# Patient Record
Sex: Male | Born: 1955
Health system: Southern US, Community
[De-identification: ages and names within clinical notes are randomized; demographics above are authoritative.]

## PROBLEM LIST (undated history)

## (undated) DIAGNOSIS — F32A Depression, unspecified: Secondary | ICD-10-CM

## (undated) DIAGNOSIS — F329 Major depressive disorder, single episode, unspecified: Secondary | ICD-10-CM

## (undated) DIAGNOSIS — B1920 Unspecified viral hepatitis C without hepatic coma: Secondary | ICD-10-CM

## (undated) DIAGNOSIS — F419 Anxiety disorder, unspecified: Secondary | ICD-10-CM

## (undated) DIAGNOSIS — M25511 Pain in right shoulder: Secondary | ICD-10-CM

## (undated) DIAGNOSIS — F1910 Other psychoactive substance abuse, uncomplicated: Secondary | ICD-10-CM

## (undated) DIAGNOSIS — R519 Headache, unspecified: Secondary | ICD-10-CM

## (undated) DIAGNOSIS — C801 Malignant (primary) neoplasm, unspecified: Secondary | ICD-10-CM

## (undated) DIAGNOSIS — R51 Headache: Secondary | ICD-10-CM

## (undated) HISTORY — PX: INGUINAL HERNIA REPAIR: SUR1180

## (undated) HISTORY — PX: WISDOM TOOTH EXTRACTION: SHX21

## (undated) HISTORY — PX: POLYPECTOMY: SHX149

## (undated) HISTORY — DX: Anxiety disorder, unspecified: F41.9

## (undated) HISTORY — PX: REVERSE TOTAL SHOULDER ARTHROPLASTY: SHX2344

## (undated) HISTORY — DX: Headache: R51

## (undated) HISTORY — DX: Malignant (primary) neoplasm, unspecified: C80.1

## (undated) HISTORY — DX: Depression, unspecified: F32.A

## (undated) HISTORY — DX: Headache, unspecified: R51.9

## (undated) HISTORY — PX: COLONOSCOPY: SHX174

## (undated) HISTORY — DX: Major depressive disorder, single episode, unspecified: F32.9

## (undated) HISTORY — DX: Other psychoactive substance abuse, uncomplicated: F19.10

## (undated) HISTORY — DX: Unspecified viral hepatitis C without hepatic coma: B19.20

## (undated) HISTORY — DX: Pain in right shoulder: M25.511

## (undated) HISTORY — PX: OTHER SURGICAL HISTORY: SHX169

## (undated) HISTORY — PX: JOINT REPLACEMENT: SHX530

## (undated) HISTORY — PX: TOTAL HIP ARTHROPLASTY: SHX124

---

## 2007-04-28 ENCOUNTER — Ambulatory Visit: Payer: Self-pay | Admitting: Family Medicine

## 2007-05-10 ENCOUNTER — Ambulatory Visit: Payer: Self-pay | Admitting: Family Medicine

## 2007-05-10 LAB — CONVERTED CEMR LAB
ALT: 45 units/L (ref 0–53)
AST: 31 units/L (ref 0–37)
Albumin: 3.7 g/dL (ref 3.5–5.2)
Alkaline Phosphatase: 49 units/L (ref 39–117)
BUN: 15 mg/dL (ref 6–23)
Basophils Absolute: 0 10*3/uL (ref 0.0–0.1)
Basophils Relative: 0.4 % (ref 0.0–1.0)
Bilirubin Urine: NEGATIVE
Bilirubin, Direct: 0.2 mg/dL (ref 0.0–0.3)
CO2: 31 meq/L (ref 19–32)
Calcium: 9.2 mg/dL (ref 8.4–10.5)
Chloride: 104 meq/L (ref 96–112)
Cholesterol: 163 mg/dL (ref 0–200)
Creatinine, Ser: 1 mg/dL (ref 0.4–1.5)
Crystals: NEGATIVE
Eosinophils Absolute: 0.1 10*3/uL (ref 0.0–0.6)
Eosinophils Relative: 2 % (ref 0.0–5.0)
GFR calc Af Amer: 102 mL/min
GFR calc non Af Amer: 84 mL/min
Glucose, Bld: 82 mg/dL (ref 70–99)
HCT: 48.9 % (ref 39.0–52.0)
HCV Ab: POSITIVE — AB
HDL: 35.1 mg/dL — ABNORMAL LOW (ref >39.0)
Hemoglobin: 16.3 g/dL (ref 13.0–17.0)
Ketones, ur: NEGATIVE mg/dL
LDL Cholesterol: 118 mg/dL — ABNORMAL HIGH (ref 0–99)
Leukocytes, UA: NEGATIVE
Lymphocytes Relative: 37.4 % (ref 12.0–46.0)
MCHC: 33.3 g/dL (ref 30.0–36.0)
MCV: 95.6 fL (ref 78.0–100.0)
Monocytes Absolute: 0.6 10*3/uL (ref 0.2–0.7)
Monocytes Relative: 10.4 % (ref 3.0–11.0)
Mucus, UA: NEGATIVE
Neutro Abs: 3.2 10*3/uL (ref 1.4–7.7)
Neutrophils Relative %: 49.8 % (ref 43.0–77.0)
Nitrite: NEGATIVE
PSA: 1.32 ng/mL (ref 0.10–4.00)
Platelets: 214 10*3/uL (ref 150–400)
Potassium: 3.6 meq/L (ref 3.5–5.1)
RBC: 5.11 M/uL (ref 4.22–5.81)
RDW: 12.4 % (ref 11.5–14.6)
Sodium: 144 meq/L (ref 135–145)
Specific Gravity, Urine: 1.02 (ref 1.000–1.03)
TSH: 2.08 microintl units/mL (ref 0.35–5.50)
Total Bilirubin: 1.3 mg/dL — ABNORMAL HIGH (ref 0.3–1.2)
Total CHOL/HDL Ratio: 4.6
Total Protein, Urine: NEGATIVE mg/dL
Total Protein: 6.8 g/dL (ref 6.0–8.3)
Triglycerides: 49 mg/dL (ref 0–149)
Urine Glucose: NEGATIVE mg/dL
Urobilinogen, UA: 0.2 (ref 0.0–1.0)
VLDL: 10 mg/dL (ref 0–40)
WBC: 6.2 10*3/uL (ref 4.5–10.5)
pH: 6 (ref 5.0–8.0)

## 2007-05-18 ENCOUNTER — Ambulatory Visit: Payer: Self-pay | Admitting: Family Medicine

## 2007-05-24 ENCOUNTER — Ambulatory Visit: Payer: Self-pay | Admitting: Gastroenterology

## 2007-07-10 ENCOUNTER — Ambulatory Visit: Payer: Self-pay | Admitting: Gastroenterology

## 2007-07-10 ENCOUNTER — Encounter: Payer: Self-pay | Admitting: Family Medicine

## 2007-07-10 ENCOUNTER — Encounter: Payer: Self-pay | Admitting: Gastroenterology

## 2007-09-06 DIAGNOSIS — B182 Chronic viral hepatitis C: Secondary | ICD-10-CM | POA: Insufficient documentation

## 2008-06-13 ENCOUNTER — Telehealth: Payer: Self-pay | Admitting: Family Medicine

## 2008-09-18 ENCOUNTER — Telehealth: Payer: Self-pay | Admitting: Family Medicine

## 2008-10-01 ENCOUNTER — Ambulatory Visit: Payer: Self-pay | Admitting: Family Medicine

## 2008-10-01 LAB — CONVERTED CEMR LAB
ALT: 38 units/L (ref 0–53)
AST: 30 units/L (ref 0–37)
Albumin: 4.2 g/dL (ref 3.5–5.2)
Alkaline Phosphatase: 58 units/L (ref 39–117)
BUN: 12 mg/dL (ref 6–23)
Basophils Absolute: 0 10*3/uL (ref 0.0–0.1)
Basophils Relative: 0.2 % (ref 0.0–3.0)
Bilirubin Urine: NEGATIVE
Bilirubin, Direct: 0.1 mg/dL (ref 0.0–0.3)
Blood in Urine, dipstick: NEGATIVE
CO2: 32 meq/L (ref 19–32)
Calcium: 9.5 mg/dL (ref 8.4–10.5)
Chloride: 103 meq/L (ref 96–112)
Cholesterol: 214 mg/dL (ref 0–200)
Creatinine, Ser: 1.1 mg/dL (ref 0.4–1.5)
Direct LDL: 141.1 mg/dL
Eosinophils Absolute: 0.1 10*3/uL (ref 0.0–0.7)
Eosinophils Relative: 1.1 % (ref 0.0–5.0)
GFR calc Af Amer: 91 mL/min
GFR calc non Af Amer: 75 mL/min
Glucose, Bld: 103 mg/dL — ABNORMAL HIGH (ref 70–99)
Glucose, Urine, Semiquant: NEGATIVE
HCT: 50.1 % (ref 39.0–52.0)
HCV Quantitative: 13600000 intl units/mL — ABNORMAL HIGH (ref <43)
HDL: 48.9 mg/dL (ref >39.0)
Hemoglobin: 17.2 g/dL — ABNORMAL HIGH (ref 13.0–17.0)
Ketones, urine, test strip: NEGATIVE
Lymphocytes Relative: 25.3 % (ref 12.0–46.0)
MCHC: 34.3 g/dL (ref 30.0–36.0)
MCV: 95.8 fL (ref 78.0–100.0)
Monocytes Absolute: 0.5 10*3/uL (ref 0.1–1.0)
Monocytes Relative: 7.1 % (ref 3.0–12.0)
Neutro Abs: 4.8 10*3/uL (ref 1.4–7.7)
Neutrophils Relative %: 66.3 % (ref 43.0–77.0)
Nitrite: NEGATIVE
PSA: 2.04 ng/mL (ref 0.10–4.00)
Platelets: 200 10*3/uL (ref 150–400)
Potassium: 3.7 meq/L (ref 3.5–5.1)
Protein, U semiquant: NEGATIVE
RBC: 5.23 M/uL (ref 4.22–5.81)
RDW: 12.3 % (ref 11.5–14.6)
Sodium: 141 meq/L (ref 135–145)
Specific Gravity, Urine: 1.015
TSH: 2.92 microintl units/mL (ref 0.35–5.50)
Total Bilirubin: 1.2 mg/dL (ref 0.3–1.2)
Total CHOL/HDL Ratio: 4.4
Total Protein: 7.8 g/dL (ref 6.0–8.3)
Triglycerides: 54 mg/dL (ref 0–149)
Urobilinogen, UA: 0.2
VLDL: 11 mg/dL (ref 0–40)
WBC Urine, dipstick: NEGATIVE
WBC: 7.2 10*3/uL (ref 4.5–10.5)
pH: 7

## 2008-10-08 ENCOUNTER — Ambulatory Visit: Payer: Self-pay | Admitting: Family Medicine

## 2008-10-08 DIAGNOSIS — F329 Major depressive disorder, single episode, unspecified: Secondary | ICD-10-CM

## 2008-10-08 DIAGNOSIS — F32A Depression, unspecified: Secondary | ICD-10-CM | POA: Insufficient documentation

## 2008-10-14 ENCOUNTER — Telehealth: Payer: Self-pay | Admitting: Family Medicine

## 2008-12-19 ENCOUNTER — Ambulatory Visit: Payer: Self-pay | Admitting: Gastroenterology

## 2010-03-09 ENCOUNTER — Telehealth: Payer: Self-pay | Admitting: Family Medicine

## 2010-03-10 ENCOUNTER — Telehealth: Payer: Self-pay | Admitting: Family Medicine

## 2010-03-12 ENCOUNTER — Ambulatory Visit: Payer: Self-pay | Admitting: Family Medicine

## 2010-03-12 DIAGNOSIS — B977 Papillomavirus as the cause of diseases classified elsewhere: Secondary | ICD-10-CM | POA: Insufficient documentation

## 2010-05-21 ENCOUNTER — Ambulatory Visit: Payer: Self-pay | Admitting: Family Medicine

## 2010-05-21 LAB — CONVERTED CEMR LAB
ALT: 47 units/L (ref 0–53)
AST: 35 units/L (ref 0–37)
Albumin: 4.1 g/dL (ref 3.5–5.2)
Alkaline Phosphatase: 49 units/L (ref 39–117)
BUN: 18 mg/dL (ref 6–23)
Basophils Absolute: 0 10*3/uL (ref 0.0–0.1)
Basophils Relative: 0.5 % (ref 0.0–3.0)
Bilirubin Urine: NEGATIVE
Bilirubin, Direct: 0.2 mg/dL (ref 0.0–0.3)
Blood in Urine, dipstick: NEGATIVE
CO2: 28 meq/L (ref 19–32)
Calcium: 9.6 mg/dL (ref 8.4–10.5)
Chloride: 101 meq/L (ref 96–112)
Cholesterol: 184 mg/dL (ref 0–200)
Creatinine, Ser: 1 mg/dL (ref 0.4–1.5)
Eosinophils Absolute: 0.1 10*3/uL (ref 0.0–0.7)
Eosinophils Relative: 1.2 % (ref 0.0–5.0)
GFR calc non Af Amer: 82.08 mL/min (ref >60)
Glucose, Bld: 78 mg/dL (ref 70–99)
Glucose, Urine, Semiquant: NEGATIVE
HCT: 47.6 % (ref 39.0–52.0)
HDL: 39.4 mg/dL (ref >39.00)
Hemoglobin: 16.3 g/dL (ref 13.0–17.0)
Ketones, urine, test strip: NEGATIVE
LDL Cholesterol: 131 mg/dL — ABNORMAL HIGH (ref 0–99)
Lymphocytes Relative: 35.6 % (ref 12.0–46.0)
Lymphs Abs: 2.1 10*3/uL (ref 0.7–4.0)
MCHC: 34.2 g/dL (ref 30.0–36.0)
MCV: 99.1 fL (ref 78.0–100.0)
Monocytes Absolute: 0.6 10*3/uL (ref 0.1–1.0)
Monocytes Relative: 9.9 % (ref 3.0–12.0)
Neutro Abs: 3.2 10*3/uL (ref 1.4–7.7)
Neutrophils Relative %: 52.8 % (ref 43.0–77.0)
Nitrite: NEGATIVE
PSA: 1.73 ng/mL (ref 0.10–4.00)
Platelets: 214 10*3/uL (ref 150.0–400.0)
Potassium: 4.5 meq/L (ref 3.5–5.1)
Protein, U semiquant: NEGATIVE
RBC: 4.81 M/uL (ref 4.22–5.81)
RDW: 13.4 % (ref 11.5–14.6)
Sodium: 138 meq/L (ref 135–145)
Specific Gravity, Urine: 1.02
TSH: 2.1 microintl units/mL (ref 0.35–5.50)
Total Bilirubin: 1.1 mg/dL (ref 0.3–1.2)
Total CHOL/HDL Ratio: 5
Total Protein: 7.4 g/dL (ref 6.0–8.3)
Triglycerides: 67 mg/dL (ref 0.0–149.0)
Urobilinogen, UA: 0.2
VLDL: 13.4 mg/dL (ref 0.0–40.0)
WBC Urine, dipstick: NEGATIVE
WBC: 6 10*3/uL (ref 4.5–10.5)
pH: 7

## 2010-05-28 ENCOUNTER — Ambulatory Visit: Payer: Self-pay | Admitting: Family Medicine

## 2010-06-04 ENCOUNTER — Ambulatory Visit: Payer: Self-pay | Admitting: Family Medicine

## 2010-06-05 DIAGNOSIS — L820 Inflamed seborrheic keratosis: Secondary | ICD-10-CM | POA: Insufficient documentation

## 2010-12-15 NOTE — Assessment & Plan Note (Signed)
Summary: cpx/njr   Vital Signs:  Patient Profile:   55 Years Old Male Height:     69.75 inches Weight:      172 pounds Temp:     98.6 degrees F oral Pulse rate:   64 / minute Pulse rhythm:   regular BP sitting:   120 / 80  (left arm) Cuff size:   regular  Vitals Entered By: [name] CMA (October 08, 2008 10:46 AM)                 Chief Complaint:  cpx.  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  See and mild depression.  The depressions and treated with Celexa 20 mg a day.  Is having some breakthrough symptoms.  Would like to increase the dose.  He said history of hep C., in 2008 we discussed referral to the hepatitis c clinic.  However, he hasn't gone yet now he would like to go.  He remains asymptomatic.  His father's brother was recently found to have polycythemia develop secondary liver disease and died.  I encouraged him to follow-up with the GI folks about this issue    Current Allergies: No known allergies   Past Medical History:    Reviewed history from 09/06/2007 and no changes required:       Hepatitis C       Depression  Past Surgical History:    Reviewed history from 09/06/2007 and no changes required:       Inguinal herniorrhaphy, R       BCE R arm   Family History:    Reviewed history and no changes required:       mother 15s, hypertension, migraine headaches, and a TIA.              Father late 73s, Parkinson disease, BPH, diverticular bleed.              One brother in good health.              Two sisters, one in good health.  The other has degenerative joint disease  Social History:    Reviewed history and no changes required:       Divorced       Never Smoked       Alcohol use-no       Drug use-no       Regular exercise-yes   Risk Factors:  Tobacco use:  never Drug use:  no Alcohol use:   no Exercise:  yes   Review of Systems      See HPI   Physical Exam  General:     Well-developed,well-nourished,in no acute distress; alert,appropriate and cooperative throughout examination Head:     Normocephalic and atraumatic without obvious abnormalities. No apparent alopecia or balding. Eyes:     No corneal or conjunctival inflammation noted. EOMI. Perrla. Funduscopic exam benign, without hemorrhages, exudates or papilledema. Vision grossly normal. Ears:     External ear exam shows no significant lesions or deformities.  Otoscopic examination reveals clear canals, tympanic membranes are intact bilaterally without bulging, retraction, inflammation or discharge. Hearing is grossly normal bilaterally. Nose:     External nasal examination shows no deformity or inflammation. Nasal mucosa are pink and moist without lesions or exudates. Mouth:     Oral mucosa and oropharynx without lesions or exudates.  Teeth  in good repair. Neck:     No deformities, masses, or tenderness noted. Chest Wall:     No deformities, masses, tenderness or gynecomastia noted. Breasts:     No masses or gynecomastia noted Lungs:     Normal respiratory effort, chest expands symmetrically. Lungs are clear to auscultation, no crackles or wheezes. Heart:     Normal rate and regular rhythm. S1 and S2 normal without gallop, murmur, click, rub or other extra sounds. Abdomen:     Bowel sounds positive,abdomen soft and non-tender without masses, organomegaly or hernias noted. Rectal:     No external abnormalities noted. Normal sphincter tone. No rectal masses or tenderness. Genitalia:     Testes bilaterally descended without nodularity, tenderness or masses. No scrotal masses or lesions. No penis lesions or urethral discharge. Prostate:     Prostate gland firm and smooth, no enlargement, nodularity, tenderness, mass, asymmetry or induration. Msk:     No deformity or scoliosis noted of thoracic or lumbar spine.     Pulses:     R and L carotid,radial,femoral,dorsalis pedis and posterior tibial pulses are full and equal bilaterally Extremities:     No clubbing, cyanosis, edema, or deformity noted with normal full range of motion of all joints.   Neurologic:     No cranial nerve deficits noted. Station and gait are normal. Plantar reflexes are down-going bilaterally. DTRs are symmetrical throughout. Sensory, motor and coordinative functions appear intact. Skin:     total skin exam shows a scar in his right forearm, where he had a BCE removed years ago.  He has light scan in light eyes and had a fair amount of sun exposure as a teenager.  Total skin exam shows no evidence of any malignancies. Cervical Nodes:     No lymphadenopathy noted Axillary Nodes:     No palpable lymphadenopathy Inguinal Nodes:     No significant adenopathy Psych:     Cognition and judgment appear intact. Alert and cooperative with normal attention span and concentration. No apparent delusions, illusions, hallucinations    Impression & Recommendations:  Problem # 1:  HEPATITIS C (ICD-070.51) Assessment: Unchanged  Orders: Hepatitis C Clinic Referral (HepC)   Problem # 2:  DEPRESSION (ICD-311) Assessment: Deteriorated  The following medications were removed from the medication list:    Lexapro 10 Mg Tabs (Escitalopram oxalate) ..... Qd  His updated medication list for this problem includes:    Citalopram Hydrobromide 20 Mg Tabs (Citalopram hydrobromide) .Marland Kitchen... Take one tab at bedtime    Celexa 40 Mg Tabs (Citalopram hydrobromide) .Marland Kitchen... 1 tab @ bedtime   Complete Medication List: 1)  Citalopram Hydrobromide 20 Mg Tabs (Citalopram hydrobromide) .... Take one tab at bedtime 2)  Celexa 40 Mg Tabs (Citalopram hydrobromide) .Marland Kitchen.. 1 tab @ bedtime  Other Orders: EKG w/ Interpretation (93000)   Patient Instructions: 1)  increase the Celexa to 40 mg a day.  If after 4 weeks.  He is feeling well.  Continue that dose.  If  not call me. 2)  We will get she sat up in the hepatitis see clinic for further evaluation.   Prescriptions: CELEXA 40 MG TABS (CITALOPRAM HYDROBROMIDE) 1 tab @ bedtime  #100 x 3   Entered and Authorized by:   [name] MD   Signed by:   [name] MD on 10/08/2008   Method used:   Electronically to        [facility] (retail)  [address] [place], [place]  [address]       Ph: [phone]       Fax: [phone]   RxID:   [id number]  ]

## 2010-12-15 NOTE — Progress Notes (Signed)
  Phone Note Outgoing Call   Call placed by: jat Call placed to: Patient Summary of Call: called patient's cell phone.  Left message, genotype one A. will print out.  Hard copy and sent to the house for his records Initial call taken by: [name] MD,  October 14, 2008 8:23 AM

## 2010-12-15 NOTE — Progress Notes (Signed)
Summary: Addidional labs requested  Phone Note Call from Patient Call back at Home Phone [phone]   Caller: Patient Triage VM Call For: Tawanna Cooler Summary of Call: Triage VM,  pt has labs scheduled for 11/17, he is requesting a Hep C viral load and geno-type be added to labs Initial call taken by: Sid Falcon LPN,  September 18, 2008 12:33 PM  Follow-up for Phone Call        ok Follow-up by: [name] MD,  September 19, 2008 8:22 AM  Additional Follow-up for Phone Call Additional follow up Details #1::        Labs added to CPX labs 11/17. Additional Follow-up by: Sid Falcon LPN,  September 19, 2008 3:32 PM

## 2010-12-15 NOTE — Progress Notes (Signed)
Summary: Pt sch ov for 03/12/10. Req partial refill of Citalopram  Phone Note Call from Patient Call back at [facility] Phone [phone]   Caller: Patient Summary of Call: Pt has an ov sch for 03/12/10 at 3:15. Pt would like a partial script of Citalopram called in to [facility] [phone]. Initial call taken by: Lucy Antigua,  March 10, 2010 4:38 PM  Follow-up for Phone Call        rx has dr redd on it.  is this okay to fill? last office visit 11/09 Follow-up by: [name] CMA [name]),  March 10, 2010 4:57 PM  Additional Follow-up for Phone Call Additional follow up Details #1::        will see tomorrow, and write prescription Additional Follow-up by: [name] MD,  March 11, 2010 11:24 AM    Additional Follow-up for Phone Call Additional follow up Details #2::    tried to call home number but answering machine not working properly Follow-up by: [name] CMA [name]),  March 11, 2010 11:52 AM

## 2010-12-15 NOTE — Assessment & Plan Note (Signed)
Summary: MEDICATION CK (REFILLS) // RS   Vital Signs:  Patient profile:   55 year old male Height:      69.75 inches Weight:      161 pounds BMI:     23.35 Temp:     98.4 degrees F oral BP sitting:   120 / 78  (left arm) Cuff size:   regular  Vitals Entered By: Christopher Leonard) (March 12, 2010 3:07 PM) CC: follow-up visit medication   CC:  follow-up visit medication.  History of Present Illness: Christopher Leonard is a 55 year old male, who comes in today for evaluation of 3 issues.  He has underlying mild depression, for which she takes Celexa 40 mg daily.  He feels well at that dose.  He has some HPV lesions on his penis.  He would like treated.  Christopher Leonard just came out with a new product for hep C. recommend he consult with his gastroenterologist about that  Allergies: No Known Drug Allergies  Past History:  Past medical, surgical, family and social histories (including risk factors) reviewed for relevance to current acute and chronic problems.  Past Medical History: Reviewed history from 10/08/2008 and no changes required. Hepatitis C Depression  Past Surgical History: Reviewed history from 09/06/2007 and no changes required. Inguinal herniorrhaphy, R BCE R arm  Family History: Reviewed history from 10/08/2008 and no changes required. mother 65s, hypertension, migraine headaches, and a TIA.  Father late 65s, Parkinson disease, BPH, diverticular bleed.  One brother in good health.  Two sisters, one in good health.  The other has degenerative joint disease  Social History: Reviewed history from 10/08/2008 and no changes required. Divorced Never Smoked Alcohol use-no Drug use-no Regular exercise-yes  Review of Systems      See HPI  Physical Exam  General:  Well-developed,well-nourished,in no acute distress; alert,appropriate and cooperative throughout examination Genitalia:  to HPV lesions on the shaft of his penis were treated with TCA Psych:  Cognition  and judgment appear intact. Alert and cooperative with normal attention span and concentration. No apparent delusions, illusions, hallucinations   Impression & Recommendations:  Problem # 1:  DEPRESSION (ICD-311) Assessment Improved  The following medications were removed from the medication list:    Citalopram Hydrobromide 20 Mg Tabs (Citalopram hydrobromide) .Marland Kitchen... Take one tab at bedtime His updated medication list for this problem includes:    Celexa 40 Mg Tabs (Citalopram hydrobromide) .Marland Kitchen... 1 tab @ bedtime  Problem # 2:  HPV (ICD-079.4) Assessment: New  Complete Medication List: 1)  Celexa 40 Mg Tabs (Citalopram hydrobromide) .Marland Kitchen.. 1 tab @ bedtime  Patient Instructions: 1)  return p.r.n. for retreatment. 2)  Continue Celexa 40 mg daily 3)  Set up a time this summer for a complete physical exam Prescriptions: CELEXA 40 MG TABS (CITALOPRAM HYDROBROMIDE) 1 tab @ bedtime  #100 x 3   Entered and Authorized by:   Christopher Pee MD   Signed by:   Christopher Pee MD on 03/12/2010   Method used:   Electronically to        [facility] (retail)       [address] [place], [place]  [address]       Ph: [phone]       Fax: [phone]   RxID:   [id number]

## 2010-12-15 NOTE — Assessment & Plan Note (Signed)
Summary: cpx/njr   Vital Signs:  Patient profile:   55 year old male Height:      69.5 inches Weight:      161 pounds BMI:     23.52 Temp:     99.0 degrees F oral BP sitting:   128 / 88  (left arm) Cuff size:   regular  Vitals Entered By: [name] CMA [name]) (May 28, 2010 2:10 PM) CC: cpx Is Patient Diabetic? No Pain Assessment Patient in pain? no        CC:  cpx.  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  As always been in excellent health.  He said the chronic problems except for chronic depression.  He takes Celexa 40 mg daily.  If he takes 40 mg daily for a long, while he'll feel fatigued.  We discussed alternating doses.  Also taking one tablet Monday, Wednesday, Friday, different strategies, like that.  Review of systems negative.  He gets routine eye care, and dental care.  Colonoscopy last year, showed a couple, polyps he's on a recall list.  Tetanus 2003, seasonal flu 2010,  Allergies (verified): No Known Drug Allergies  Past History:  Past medical, surgical, family and social histories (including risk factors) reviewed, and no changes noted (except as noted below).  Past Medical History: Reviewed history from 10/08/2008 and no changes required. Hepatitis C Depression  Past Surgical History: Reviewed history from 09/06/2007 and no changes required. Inguinal herniorrhaphy, R BCE R arm  Family History: Reviewed history from 10/08/2008 and no changes required. mother 25s, hypertension, migraine headaches, and a TIA.  Father late 58s, Parkinson disease, BPH, diverticular bleed.  One brother in good health.  Two sisters, one in good health.  The other has degenerative joint disease  Social History: Reviewed history from 10/08/2008 and no changes required. Divorced Never Smoked Alcohol use-no Drug use-no Regular exercise-yes  Review of Systems      See  HPI  Physical Exam  General:  Well-developed,well-nourished,in no acute distress; alert,appropriate and cooperative throughout examination Head:  Normocephalic and atraumatic without obvious abnormalities. No apparent alopecia or balding. Eyes:  No corneal or conjunctival inflammation noted. EOMI. Perrla. Funduscopic exam benign, without hemorrhages, exudates or papilledema. Vision grossly normal. Ears:  right ear canal and eardrum normal left ear canal.  There is a cystic, lesion that's occluding about 70% of the orifice Nose:  External nasal examination shows no deformity or inflammation. Nasal mucosa are pink and moist without lesions or exudates. Mouth:  Oral mucosa and oropharynx without lesions or exudates.  Teeth in good repair. Neck:  No deformities, masses, or tenderness noted. Chest Wall:  No deformities, masses, tenderness or gynecomastia noted. Breasts:  No masses or gynecomastia noted Lungs:  Normal respiratory effort, chest expands symmetrically. Lungs are clear to auscultation, no crackles or wheezes. Heart:  Normal rate and regular rhythm. S1 and S2 normal without gallop, murmur, click, rub or other extra sounds. Abdomen:  Bowel sounds positive,abdomen soft and non-tender without masses, organomegaly or hernias noted. Rectal:  No external abnormalities noted. Normal sphincter tone. No rectal masses or tenderness. Genitalia:  Testes bilaterally descended without nodularity, tenderness or masses. No scrotal masses or lesions. No penis lesions or urethral discharge. Prostate:  Prostate gland firm and smooth, no enlargement, nodularity, tenderness, mass, asymmetry or induration. Msk:  No deformity or scoliosis noted of thoracic or lumbar spine.   Pulses:  R and L carotid,radial,femoral,dorsalis pedis and posterior tibial pulses are full and equal bilaterally Extremities:  No clubbing, cyanosis, edema, or deformity noted with normal full range of motion of all joints.   Neurologic:   No cranial nerve deficits noted. Station and gait are normal. Plantar reflexes are down-going bilaterally. DTRs are symmetrical throughout. Sensory, motor and coordinative functions appear intact. Skin:  total body skin exam normal except for a lesion on his forehead that appears to be a basal cell advised to return for removal Cervical Nodes:  No lymphadenopathy noted Axillary Nodes:  No palpable lymphadenopathy Inguinal Nodes:  No significant adenopathy Psych:  Cognition and judgment appear intact. Alert and cooperative with normal attention span and concentration. No apparent delusions, illusions, hallucinations   Impression & Recommendations:  Problem # 1:  ROUTINE GENERAL MEDICAL EXAM@HEALTH  CARE FACL (ICD-V70.0) Assessment Unchanged  Orders: Prescription Created Electronically [id number]) EKG w/ Interpretation (93000)  Problem # 2:  DEPRESSION (ICD-311) Assessment: Improved  His updated medication list for this problem includes:    Celexa 40 Mg Tabs (Citalopram hydrobromide) .Marland Kitchen... 1 & 1/2 at bedtime  Orders: Prescription Created Electronically [id number])  Complete Medication List: 1)  Celexa 40 Mg Tabs (Citalopram hydrobromide) .Marland Kitchen.. 1 & 1/2 at bedtime  Patient Instructions: 1)  continue current medications.  Return next week for removal of the lesion on y  forehead Prescriptions: CELEXA 40 MG TABS (CITALOPRAM HYDROBROMIDE) 1 & 1/2 at bedtime  #150 x 3   Entered and Authorized by:   [name] MD   Signed by:   [name] MD on 05/28/2010   Method used:   Electronically to        [facility] (retail)       [address] [place], [place]  [address]       Ph: [phone]       Fax: [phone]   RxID:   [id number]    Immunization History:  Tetanus/Td Immunization History:    Tetanus/Td:  historical (11/15/2001)  Influenza Immunization History:    Influenza:  historical (08/15/2009)

## 2010-12-15 NOTE — Assessment & Plan Note (Signed)
Summary: CHECK SPOT//CCM   Vital Signs:  Patient profile:   55 year old male Weight:      161 pounds Temp:     98.4 degrees F oral BP sitting:   130 / 90  (left arm) Cuff size:   regular  Vitals Entered By: Kathrynn Speed CMA (June 04, 2010 2:10 PM) CC: remove spot on right forhead, src   Procedure Note Last Tetanus: Historical (11/15/2001)  Mole Biopsy/Removal: Indication: suspicious lesion Consent signed: yes  Procedure # 1: elliptical incision with 2 mm margin    Size (in cm): 0.5 x 0.5    Region: anterior    Location: forehead    Instrument used: #15 blade    Anesthesia: 1% lidocaine w/epinephrine    Closure: cautery  Cleaned and prepped with: alcohol Wound dressing: bandaid   CC:  remove spot on right forhead and src.  History of Present Illness: Christopher Leonard is a 62-year-old male, who comes in today for removal of the shunt is right for him.  He's had a lesion on S4 head, however, a couple weeks ago, it became umbilicated  Current Medications (verified): 1)  Celexa 40 Mg Tabs (Citalopram Hydrobromide) .Marland Kitchen.. 1 & 1/2 At Bedtime  Allergies (verified): No Known Drug Allergies   Complete Medication List: 1)  Celexa 40 Mg Tabs (Citalopram hydrobromide) .Marland Kitchen.. 1 & 1/2 at bedtime  Other Orders: Shave Skin Lesion 0.6-1.0cm face/ears/eyelids/nose/lips/mm (11311)

## 2010-12-15 NOTE — Procedures (Signed)
Summary: colonoscopy report  colonoscopy report   Imported By: Kassie Mends 07/25/2007 10:20:13  _____________________________________________________________________  External Attachment:    Type:   Image     Comment:   colonoscopy report

## 2010-12-15 NOTE — Progress Notes (Signed)
Summary: REQ FOR REFILL (CITALOPRAM HYDROBROMIDE)  Phone Note Refill Request Message from:  Patient on March 09, 2010 12:25 PM  Refills Requested: Medication #1:  CITALOPRAM HYDROBROMIDE 20 MG  TABS take one tab at bedtime   Notes: Pt scheduled appt with Dr Tawanna Cooler for this Thursday, 03/12/2010 at 3:15pm for med ck / refills. Initial call taken by: Debbra Riding,  March 09, 2010 12:25 PM    Prescriptions: CITALOPRAM HYDROBROMIDE 20 MG  TABS (CITALOPRAM HYDROBROMIDE) take one tab at bedtime  #30 x 0   Entered by:   [name] CMA (AAMA)   Authorized by:   [name] MD   Signed by:   [name] CMA (AAMA) on 03/09/2010   Method used:   Electronically to        [facility] (retail)       [address] [place], [place]  [address]       Ph: [phone]       Fax: [phone]   RxID:   [id number]

## 2010-12-15 NOTE — Progress Notes (Signed)
Summary: citalopram refill  Phone Note From Pharmacy   Reason for Call: Medication not on formulary Details for Reason: refill Summary of Call: pt would like a refill of citalopram is this okay? Initial call taken by: [name] CMA,  June 13, 2008 11:59 AM  Follow-up for Phone Call        okayed refill for one year Follow-up by: [name] MD,  June 17, 2008 7:49 AM  Additional Follow-up for Phone Call Additional follow up Details #1::        Pharmacist called Additional Follow-up by: [name] CMA,  June 17, 2008 10:03 AM

## 2011-03-25 ENCOUNTER — Other Ambulatory Visit: Payer: Self-pay | Admitting: Family Medicine

## 2011-03-30 NOTE — Assessment & Plan Note (Signed)
[facility] OFFICE NOTE   TERRICK, ALLRED                          MRN:          [id number]  DATE:04/28/2007                            DOB:          1956/03/31    Christopher Leonard is a 55 year old, single male who comes in today to be established  as a new patient for multiple concerns.   PAST MEDICAL HISTORY/PAST HOSPITALIZATIONS:  None.   OUTPATIENT SURGERY:  None.   ILLNESSES:  None.   INJURIES:  None.   DRUG ALLERGIES:  None.   He does not smoke, drink any alcohol or use any drugs.   CURRENT MEDICATIONS:  Lexapro 10 mg daily for anxiety/depression.   REVIEW OF SYSTEMS:  HEENT:  Negative except he wears contacts. He gets  regular dental care. CARDIOPULMONARY:  Negative. GI:  Pertinent. He was  diagnosed to have hepatitis C. He said he was an IV drug abuser as a  teenager and a young man, developed hepatitis C. He has tested positive,  he never had an evaluation since that time. He has no symptoms. GU:  Negative. ENDOCRINE:  Negative. Weight steady. MUSCULOSKELETAL:  Negative. VASCULAR:  Negative. ALLERGY:  Negative except he had some  adult acne. He also had a BCE removed. He does have light skin and light  eyes.   SOCIAL HISTORY:  He is originally from [place] and went to [employer]. 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 He is divorced, he has a 54 year old daughter.   FAMILY HISTORY:  Dad is in his late 40s, he has Parkinson's disease, BPH  and had a diverticular bleed. Mom is 13 and has hypertension, migraine  headaches and has had a TIA. One brother in good health, two sisters,  one has joint problems, the other, Okey Regal, has a history of degenerative  joint disease also.   VACCINATION HISTORY:  Last tetanus in 2003.   PHYSICAL EXAMINATION:  VITAL SIGNS:  Height 5 foot 10, weight 155. Blood  pressure 120/80, pulse 70 and regular, he is afebrile.  GENERAL:  He is a well-developed, well-nourished male in no acute  distress.   We discussed his overall health habits.  1. Hepatitis C. He needs a complete evaluation. We will start that      process now.  2. Depression. Continue the Lexapro. I rewrote that for him. At 51, he      also needs a screening colonoscopy.   Labs were drawn, he will get back for CPX as soon as possible and then a  hepatitis C referral.     Eugenio Hoes. Tawanna Cooler, MD  Electronically Signed    JAT/MedQ  DD: 05/01/2007  DT: 05/01/2007  Job #: [id number]

## 2011-11-06 ENCOUNTER — Other Ambulatory Visit: Payer: Self-pay | Admitting: Family Medicine

## 2012-04-11 ENCOUNTER — Telehealth: Payer: Self-pay | Admitting: *Deleted

## 2012-04-11 MED ORDER — CITALOPRAM HYDROBROMIDE 20 MG PO TABS: 20.0000 mg | ORAL_TABLET | Freq: Every day | ORAL | Status: DC

## 2012-04-11 NOTE — Telephone Encounter (Signed)
Request refill on celexa- he was taking 40 but has been cutting in half and now wants 20 mg called #[phone] with 3 refill-[facility]

## 2012-04-11 NOTE — Telephone Encounter (Signed)
Rx sent to pharmacy   

## 2012-04-11 NOTE — Telephone Encounter (Signed)
Okay 

## 2012-04-13 ENCOUNTER — Telehealth: Payer: Self-pay | Admitting: Family Medicine

## 2012-04-13 NOTE — Telephone Encounter (Signed)
Pt states he never received a call back about his decreasing the Celexa. I see the new dosage was called in, but he still wants to speak with you. Please call. Thanks!

## 2012-04-13 NOTE — Telephone Encounter (Signed)
Left message on machine for patient

## 2012-05-23 ENCOUNTER — Other Ambulatory Visit (INDEPENDENT_AMBULATORY_CARE_PROVIDER_SITE_OTHER): Payer: 59

## 2012-05-23 DIAGNOSIS — Z0000 Encounter for general adult medical examination without abnormal findings: Secondary | ICD-10-CM

## 2012-05-23 LAB — LIPID PANEL
Cholesterol: 167 mg/dL (ref 0–200)
HDL: 34.7 mg/dL — ABNORMAL LOW (ref >39.00)
LDL Cholesterol: 120 mg/dL — ABNORMAL HIGH (ref 0–99)
Total CHOL/HDL Ratio: 5
Triglycerides: 64 mg/dL (ref 0.0–149.0)
VLDL: 12.8 mg/dL (ref 0.0–40.0)

## 2012-05-23 LAB — POCT URINALYSIS DIPSTICK
Blood, UA: NEGATIVE
Glucose, UA: NEGATIVE
Ketones, UA: NEGATIVE
Leukocytes, UA: NEGATIVE
Nitrite, UA: NEGATIVE
Protein, UA: NEGATIVE
Spec Grav, UA: 1.02
Urobilinogen, UA: 0.2
pH, UA: 7

## 2012-05-23 LAB — HEPATIC FUNCTION PANEL
ALT: 186 U/L — ABNORMAL HIGH (ref 0–53)
AST: 124 U/L — ABNORMAL HIGH (ref 0–37)
Albumin: 3.5 g/dL (ref 3.5–5.2)
Alkaline Phosphatase: 59 U/L (ref 39–117)
Bilirubin, Direct: 0.1 mg/dL (ref 0.0–0.3)
Total Bilirubin: 1.2 mg/dL (ref 0.3–1.2)
Total Protein: 7.2 g/dL (ref 6.0–8.3)

## 2012-05-23 LAB — CBC WITH DIFFERENTIAL/PLATELET
Basophils Absolute: 0 10*3/uL (ref 0.0–0.1)
Basophils Relative: 0.5 % (ref 0.0–3.0)
Eosinophils Absolute: 0.2 10*3/uL (ref 0.0–0.7)
Eosinophils Relative: 2.7 % (ref 0.0–5.0)
HCT: 46.8 % (ref 39.0–52.0)
Hemoglobin: 15.7 g/dL (ref 13.0–17.0)
Lymphocytes Relative: 36.6 % (ref 12.0–46.0)
Lymphs Abs: 2.3 10*3/uL (ref 0.7–4.0)
MCHC: 33.5 g/dL (ref 30.0–36.0)
MCV: 97.7 fl (ref 78.0–100.0)
Monocytes Absolute: 0.6 10*3/uL (ref 0.1–1.0)
Monocytes Relative: 10.4 % (ref 3.0–12.0)
Neutro Abs: 3.1 10*3/uL (ref 1.4–7.7)
Neutrophils Relative %: 49.8 % (ref 43.0–77.0)
Platelets: 176 10*3/uL (ref 150.0–400.0)
RBC: 4.79 Mil/uL (ref 4.22–5.81)
RDW: 13.6 % (ref 11.5–14.6)
WBC: 6.2 10*3/uL (ref 4.5–10.5)

## 2012-05-23 LAB — BASIC METABOLIC PANEL
BUN: 15 mg/dL (ref 6–23)
CO2: 28 mEq/L (ref 19–32)
Calcium: 9.3 mg/dL (ref 8.4–10.5)
Chloride: 103 mEq/L (ref 96–112)
Creatinine, Ser: 1.1 mg/dL (ref 0.4–1.5)
GFR: 73.82 mL/min (ref >60.00)
Glucose, Bld: 88 mg/dL (ref 70–99)
Potassium: 4.2 mEq/L (ref 3.5–5.1)
Sodium: 138 mEq/L (ref 135–145)

## 2012-05-23 LAB — PSA: PSA: 1.53 ng/mL (ref 0.10–4.00)

## 2012-05-23 LAB — TSH: TSH: 2.08 u[IU]/mL (ref 0.35–5.50)

## 2012-05-30 ENCOUNTER — Encounter: Payer: Self-pay | Admitting: Family Medicine

## 2012-05-30 ENCOUNTER — Encounter: Payer: Self-pay | Admitting: Gastroenterology

## 2012-05-30 ENCOUNTER — Ambulatory Visit (INDEPENDENT_AMBULATORY_CARE_PROVIDER_SITE_OTHER): Payer: 59 | Admitting: Family Medicine

## 2012-05-30 VITALS — BP 110/76 | Temp 98.9°F | Ht 71.0 in | Wt 175.0 lb

## 2012-05-30 DIAGNOSIS — F3289 Other specified depressive episodes: Secondary | ICD-10-CM

## 2012-05-30 DIAGNOSIS — F329 Major depressive disorder, single episode, unspecified: Secondary | ICD-10-CM

## 2012-05-30 DIAGNOSIS — Z0000 Encounter for general adult medical examination without abnormal findings: Secondary | ICD-10-CM

## 2012-05-30 DIAGNOSIS — B1710 Acute hepatitis C without hepatic coma: Secondary | ICD-10-CM

## 2012-05-30 DIAGNOSIS — Z23 Encounter for immunization: Secondary | ICD-10-CM

## 2012-05-30 MED ORDER — CITALOPRAM HYDROBROMIDE 20 MG PO TABS: 20.0000 mg | ORAL_TABLET | Freq: Every day | ORAL | Status: DC

## 2012-05-30 NOTE — Patient Instructions (Signed)
We will request a consult for you it the hepatitis C clinic  I would recommend Dr. Norlene Campbell and Dr. Albertha Ghee for evaluation of your shoulder  Return once ear for your annual checkup  Remember to wear sunscreens SPF 50+

## 2012-05-30 NOTE — Progress Notes (Signed)
Subjective:    Patient ID: Christopher Leonard, male    DOB: [birth year], 56 y.o.   MRN: [id number]  HPI Christopher Leonard  is a 56 year old single male nonsmoker who comes in today for general physical examination  He has a history of hepatitis C currently asymptomatic but would like to keep reconsider treatment program since sees her the new treatment programs are much better. We will refer him to the hepatitis C  clinic  He has a history of mild depression for which she takes Celexa 20 mg daily  We have treated some HPV lesions on his penis he would like that checked  He said with his right shoulder now for about 5 months. In 1992 he had a partial dislocation.  He's also having pain in his right knee. In 2008 he had a traumatic injury playing basketball. Over couple weeks the pain went away. Recently he began trying a running program but can't run because of soreness in that knee. He is due for followup colonoscopy and he has  symptoms of BPH. Symptoms are dribbling and decreased stream no nocturia.  No family history of prostate cancer. 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   HENT: Negative.   Eyes: Negative.   Respiratory: Negative.   Cardiovascular: Negative.   Gastrointestinal: Negative.   Genitourinary: Negative.   Musculoskeletal: Negative.   Skin: Negative.   Neurological: Negative.   Hematological: Negative.   Psychiatric/Behavioral: Negative.        Objective:   Physical Exam  Constitutional: He is oriented to person, place, and time. He appears well-developed and well-nourished.  HENT:  Head: Normocephalic and atraumatic.  Right Ear: External ear normal.  Left Ear: External ear normal.  Nose: Nose normal.  Mouth/Throat: Oropharynx is clear and moist.  Eyes: Conjunctivae and EOM are normal. Pupils are equal, round, and reactive to light.  Neck: Normal range of motion. Neck supple. No  JVD present. No tracheal deviation present. No thyromegaly present.  Cardiovascular: Normal rate, regular rhythm, normal heart sounds and intact distal pulses.  Exam reveals no gallop and no friction rub.   No murmur heard. Pulmonary/Chest: Effort normal and breath sounds normal. No stridor. No respiratory distress. He has no wheezes. He has no rales. He exhibits no tenderness.  Abdominal: Soft. Bowel sounds are normal. He exhibits no distension and no mass. There is no tenderness. There is no rebound and no guarding.  Genitourinary: Rectum normal, prostate normal and penis normal. Guaiac negative stool. No penile tenderness.  Musculoskeletal: Normal range of motion. He exhibits no edema and no tenderness.       Decrease range of motion right shoulder consistent with a rotator cuff injury  Right knee cartilage is intact patella normal  Lymphadenopathy:    He has no cervical adenopathy.  Neurological: He is alert and oriented to person, place, and time. He has normal reflexes. No cranial nerve deficit. He exhibits normal muscle tone.  Skin: Skin is warm and dry. No rash noted. No erythema. No pallor.  Psychiatric: He has a normal mood and affect. His behavior is normal. Judgment and thought content normal.   Total body skin exam normal he has light skin and light eyes he has a 8 mm x8 mm seborrheic keratosis in the right forehead hairline       Assessment & Plan:  Healthy male  History of hepatitis C refer to the  hepatitis C. clinic for evaluation  Rotator cuff injury right shoulder referred or so  Symptoms of BPH otherwise asymptomatic observe  Right knee pain avoid running

## 2012-07-12 ENCOUNTER — Other Ambulatory Visit (HOSPITAL_COMMUNITY): Payer: Self-pay | Admitting: Specialist

## 2012-07-12 DIAGNOSIS — M25519 Pain in unspecified shoulder: Secondary | ICD-10-CM

## 2012-07-18 ENCOUNTER — Ambulatory Visit (AMBULATORY_SURGERY_CENTER): Payer: 59

## 2012-07-18 VITALS — Ht 70.0 in | Wt 172.7 lb

## 2012-07-18 DIAGNOSIS — Z800 Family history of malignant neoplasm of digestive organs: Secondary | ICD-10-CM

## 2012-07-18 DIAGNOSIS — Z860100 Personal history of colon polyps, unspecified: Secondary | ICD-10-CM

## 2012-07-18 DIAGNOSIS — Z1211 Encounter for screening for malignant neoplasm of colon: Secondary | ICD-10-CM

## 2012-07-18 MED ORDER — MOVIPREP 100 G PO SOLR: 1.0000 | Freq: Once | ORAL | Status: DC

## 2012-07-18 MED ORDER — MOVIPREP 100 G PO SOLR: ORAL | Status: DC

## 2012-07-20 ENCOUNTER — Ambulatory Visit (HOSPITAL_COMMUNITY)
Admission: RE | Admit: 2012-07-20 | Discharge: 2012-07-20 | Disposition: A | Discharge status: Home / Self Care | Payer: 59 | Source: Ambulatory Visit | Attending: Specialist | Admitting: Specialist

## 2012-07-20 ENCOUNTER — Other Ambulatory Visit (HOSPITAL_COMMUNITY): Payer: Self-pay | Admitting: Specialist

## 2012-07-20 DIAGNOSIS — S46819A Strain of other muscles, fascia and tendons at shoulder and upper arm level, unspecified arm, initial encounter: Secondary | ICD-10-CM | POA: Insufficient documentation

## 2012-07-20 DIAGNOSIS — M25519 Pain in unspecified shoulder: Secondary | ICD-10-CM

## 2012-07-20 DIAGNOSIS — M719 Bursopathy, unspecified: Secondary | ICD-10-CM | POA: Insufficient documentation

## 2012-07-20 DIAGNOSIS — M67919 Unspecified disorder of synovium and tendon, unspecified shoulder: Secondary | ICD-10-CM | POA: Insufficient documentation

## 2012-07-20 DIAGNOSIS — X58XXXA Exposure to other specified factors, initial encounter: Secondary | ICD-10-CM | POA: Insufficient documentation

## 2012-08-01 ENCOUNTER — Encounter: Payer: Self-pay | Admitting: Gastroenterology

## 2012-08-01 ENCOUNTER — Ambulatory Visit (AMBULATORY_SURGERY_CENTER): Payer: 59 | Admitting: Gastroenterology

## 2012-08-01 VITALS — BP 127/91 | HR 69 | Temp 98.4°F | Resp 15 | Ht 70.0 in | Wt 172.0 lb

## 2012-08-01 DIAGNOSIS — Z86010 Personal history of colonic polyps: Secondary | ICD-10-CM

## 2012-08-01 DIAGNOSIS — K5730 Diverticulosis of large intestine without perforation or abscess without bleeding: Secondary | ICD-10-CM

## 2012-08-01 DIAGNOSIS — D126 Benign neoplasm of colon, unspecified: Secondary | ICD-10-CM

## 2012-08-01 DIAGNOSIS — Z1211 Encounter for screening for malignant neoplasm of colon: Secondary | ICD-10-CM

## 2012-08-01 MED ORDER — SODIUM CHLORIDE 0.9 % IV SOLN: 500.0000 mL | INTRAVENOUS | Status: DC

## 2012-08-01 NOTE — Patient Instructions (Signed)

## 2012-08-01 NOTE — Progress Notes (Signed)
Patient did not have preoperative order for IV antibiotic SSI prophylaxis. (G8918)  Patient did not experience any of the following events: a burn prior to discharge; a fall within the facility; wrong site/side/patient/procedure/implant event; or a hospital transfer or hospital admission upon discharge from the facility. (G8907)  

## 2012-08-01 NOTE — Op Note (Signed)
 [facility] [address] [place], [address]   COLONOSCOPY PROCEDURE REPORT  PATIENT: Christopher Leonard, Christopher Leonard  MR#: [id number] BIRTHDATE: June 13, 1956 , 56  yrs. old GENDER: Male ENDOSCOPIST: Rachael Fee, MD REFERRED BY: PROCEDURE DATE:  08/01/2012 PROCEDURE:   Colonoscopy with snare polypectomy ASA CLASS:   Class II INDICATIONS:adenomatous polyp 2008. MEDICATIONS: Fentanyl 100 mcg IV, Versed 7 mg IV, and These medications were titrated to patient response per physician's verbal order  DESCRIPTION OF PROCEDURE:   After the risks benefits and alternatives of the procedure were thoroughly explained, informed consent was obtained.  A digital rectal exam revealed no rectal mass.   The LB [id number]  endoscope was introduced through the anus and advanced to the cecum, which was identified by both the appendix and ileocecal valve. No adverse events experienced. The quality of the prep was good.  The instrument was then slowly withdrawn as the colon was fully examined.   COLON FINDINGS: Mild diverticulosis was noted in the left colon.   A sessile polyp ranging between 3-27mm in size was found in the ascending colon.  A polypectomy was performed using snare cautery. The resection was complete and the polyp tissue was completely retrieved.   The colon mucosa was otherwise normal.  Retroflexed views revealed no abnormalities. The time to cecum=4 minutes 09 seconds.  Withdrawal time=11 minutes 56 seconds.  The scope was withdrawn and the procedure completed. COMPLICATIONS: There were no complications.  ENDOSCOPIC IMPRESSION: 1.   Mild diverticulosis was noted in the left colon 2.   One small polyp, removed and sent to pathology 3.   The colon mucosa was otherwise normal  RECOMMENDATIONS: 1.  Given your personal history of adenomatous (pre-cancerous) polyps, you will need a repeat colonoscopy in 5 years even if the polyp removed today is NOT precancerous. 2.  You  will receive a letter within 1-2 weeks with the results of your biopsy as well as final recommendations.  Please call my office if you have not received a letter after 3 weeks.   eSigned:  Rachael Fee, MD 08/01/2012 8:43 AM   cc:   PATIENT NAME:  Christopher Leonard, Christopher Leonard MR#: [id number]

## 2012-08-02 ENCOUNTER — Telehealth: Payer: Self-pay | Admitting: *Deleted

## 2012-08-02 NOTE — Telephone Encounter (Signed)
  Follow up Call-  Call back number 08/01/2012  Post procedure Call Back phone  # [phone]  Permission to leave phone message Yes     Patient questions:  Do you have a fever, pain , or abdominal swelling? no Pain Score  0 *  Have you tolerated food without any problems? yes  Have you been able to return to your normal activities? yes  Do you have any questions about your discharge instructions: Diet   no Medications  no Follow up visit  no  Do you have questions or concerns about your Care? no  Actions: * If pain score is 4 or above: No action needed, pain <4.

## 2012-08-04 ENCOUNTER — Telehealth: Payer: Self-pay | Admitting: Family Medicine

## 2012-08-04 NOTE — Telephone Encounter (Signed)
Pt called and said that he went to orthopedic referral. Pt is sch to see Hepatologist in Nov. Pt said that the Orthapedic doctor is wanting pt to have surgery on shoulder and is needs Dr Tawanna Cooler to give surgical clearance because of possible conflict with the Hepatitis. Pls call.

## 2012-08-07 ENCOUNTER — Encounter: Payer: Self-pay | Admitting: Gastroenterology

## 2012-08-07 NOTE — Telephone Encounter (Signed)
Spoke with patient and letter faxed.

## 2012-08-07 NOTE — Telephone Encounter (Signed)
Fleet Contras please send a note to the orthopedist okay for surgery

## 2013-01-01 ENCOUNTER — Other Ambulatory Visit: Payer: Self-pay | Admitting: *Deleted

## 2013-01-01 MED ORDER — CITALOPRAM HYDROBROMIDE 10 MG PO TABS: 10.0000 mg | ORAL_TABLET | Freq: Every day | ORAL | Status: DC

## 2013-01-01 NOTE — Telephone Encounter (Signed)
Patient requesting a refill of Celexa but would like 10 mg.  Rx refill sent to [facility] and Left message on machine for patient.

## 2013-01-11 ENCOUNTER — Telehealth (HOSPITAL_COMMUNITY): Payer: Self-pay | Admitting: *Deleted

## 2013-01-16 NOTE — Telephone Encounter (Signed)
error 

## 2013-08-06 ENCOUNTER — Other Ambulatory Visit: Payer: Self-pay | Admitting: Family Medicine

## 2013-08-13 ENCOUNTER — Telehealth: Payer: Self-pay | Admitting: Family Medicine

## 2013-08-13 MED ORDER — CITALOPRAM HYDROBROMIDE 10 MG PO TABS: ORAL_TABLET | ORAL | Status: DC

## 2013-08-13 NOTE — Telephone Encounter (Signed)
rx sent to pharmacy

## 2013-08-13 NOTE — Telephone Encounter (Signed)
Pt called to request a med check, after his rx request for citalopram (CELEXA) 10 MG tablet was denied. He has scheduled an appt for this end of next month, and would like his request to be approved pending this appt. Please assist.

## 2013-09-10 ENCOUNTER — Encounter: Payer: Self-pay | Admitting: Family Medicine

## 2013-09-10 ENCOUNTER — Ambulatory Visit (INDEPENDENT_AMBULATORY_CARE_PROVIDER_SITE_OTHER): Payer: 59 | Admitting: Family Medicine

## 2013-09-10 VITALS — BP 120/80 | Temp 98.5°F | Wt 170.0 lb

## 2013-09-10 DIAGNOSIS — G43009 Migraine without aura, not intractable, without status migrainosus: Secondary | ICD-10-CM | POA: Insufficient documentation

## 2013-09-10 DIAGNOSIS — N529 Male erectile dysfunction, unspecified: Secondary | ICD-10-CM

## 2013-09-10 MED ORDER — RIZATRIPTAN BENZOATE 5 MG PO TBDP: 5.0000 mg | ORAL_TABLET | ORAL | Status: DC | PRN

## 2013-09-10 MED ORDER — SILDENAFIL CITRATE 50 MG PO TABS: 50.0000 mg | ORAL_TABLET | ORAL | Status: DC | PRN

## 2013-09-10 NOTE — Patient Instructions (Signed)
Viagra 50 mg........ one half tablet one to 2 hours prior to sex   The website [facility] [place] [email or web address]  Maxalt..........Marland Kitchen 1 stat with the onset of migraine  Set up a time in the next couple weeks to remove the lesion on your midforehead.

## 2013-09-10 NOTE — Progress Notes (Signed)
  Subjective:    Patient ID: Christopher Leonard, male    DOB: 08/08/1956, 57 y.o.   MRN: [id number]  HPI  Christopher Leonard is a 57 year old male who comes in today for evaluation of 3 issues  He's noticed his migraine headaches are getting more frequent and more severe. They last sometimes a couple hours he has some occasional nausea and vomiting. He has no visual aura but he notices tinnitus gets worse just prior to the headache. The headache occurs in the right side of the scalp and behind his right eye. He has photophobia phonophobia and has altered sensation or smell when he gets a migraine  No neurologic symptoms  He took some Imitrex with marginal results the Maxalt seem to work the best.  He would also like to discuss some minor ED  He also is a lesion on his for heavy like checked   Review of Systems Review of systems otherwise negative    Objective:   Physical Exam  Well-developed well-nourished male no acute distress vital signs stable he is afebrile eye exam normal he wears contacts this normal no papilledema  Examination skin shows a seborrheic keratosis with an actinic keratosis on top of it upper left for head. Below that he has a marble-sized sebaceous cyst     Assessment & Plan:  Migraine headaches prescribed Maxalt  Abnormal appearing lesion midforehead return for removal  History of mild ED Viagra 50 mg when necessary

## 2013-09-18 ENCOUNTER — Ambulatory Visit: Payer: 59 | Admitting: Family Medicine

## 2014-02-12 ENCOUNTER — Other Ambulatory Visit: Payer: Self-pay | Admitting: Family Medicine

## 2014-05-20 ENCOUNTER — Telehealth: Payer: Self-pay | Admitting: Family Medicine

## 2014-05-20 ENCOUNTER — Other Ambulatory Visit: Payer: Self-pay | Admitting: Family Medicine

## 2014-05-20 NOTE — Telephone Encounter (Signed)
Pt is needing new rx for citalopram (celexa) 10 mg, pt only has enough meds for tomorrow. Send to [facility] [address].

## 2014-05-20 NOTE — Telephone Encounter (Signed)
done

## 2014-08-20 ENCOUNTER — Other Ambulatory Visit: Payer: Self-pay | Admitting: Family Medicine

## 2014-11-29 ENCOUNTER — Other Ambulatory Visit: Payer: Self-pay | Admitting: Family Medicine

## 2015-05-28 ENCOUNTER — Other Ambulatory Visit (INDEPENDENT_AMBULATORY_CARE_PROVIDER_SITE_OTHER): Payer: Managed Care, Other (non HMO)

## 2015-05-28 DIAGNOSIS — Z0000 Encounter for general adult medical examination without abnormal findings: Secondary | ICD-10-CM | POA: Diagnosis not present

## 2015-05-28 LAB — TSH: TSH: 1.87 u[IU]/mL (ref 0.35–4.50)

## 2015-05-28 LAB — CBC WITH DIFFERENTIAL/PLATELET
Basophils Absolute: 0 10*3/uL (ref 0.0–0.1)
Basophils Relative: 0.3 % (ref 0.0–3.0)
Eosinophils Absolute: 0.1 10*3/uL (ref 0.0–0.7)
Eosinophils Relative: 1.5 % (ref 0.0–5.0)
HCT: 48.2 % (ref 39.0–52.0)
Hemoglobin: 16.4 g/dL (ref 13.0–17.0)
Lymphocytes Relative: 35.5 % (ref 12.0–46.0)
Lymphs Abs: 2.5 10*3/uL (ref 0.7–4.0)
MCHC: 34.1 g/dL (ref 30.0–36.0)
MCV: 94.1 fl (ref 78.0–100.0)
Monocytes Absolute: 0.5 10*3/uL (ref 0.1–1.0)
Monocytes Relative: 7.5 % (ref 3.0–12.0)
Neutro Abs: 3.9 10*3/uL (ref 1.4–7.7)
Neutrophils Relative %: 55.2 % (ref 43.0–77.0)
Platelets: 205 10*3/uL (ref 150.0–400.0)
RBC: 5.13 Mil/uL (ref 4.22–5.81)
RDW: 13.6 % (ref 11.5–15.5)
WBC: 7 10*3/uL (ref 4.0–10.5)

## 2015-05-28 LAB — POCT URINALYSIS DIPSTICK
Bilirubin, UA: NEGATIVE
Glucose, UA: NEGATIVE
Ketones, UA: NEGATIVE
Nitrite, UA: NEGATIVE
Protein, UA: NEGATIVE
Spec Grav, UA: 1.02
Urobilinogen, UA: 0.2
pH, UA: 6

## 2015-05-28 LAB — LIPID PANEL
Cholesterol: 166 mg/dL (ref 0–200)
HDL: 38.5 mg/dL — ABNORMAL LOW (ref >39.00)
LDL Cholesterol: 115 mg/dL — ABNORMAL HIGH (ref 0–99)
NonHDL: 127.5
Total CHOL/HDL Ratio: 4
Triglycerides: 65 mg/dL (ref 0.0–149.0)
VLDL: 13 mg/dL (ref 0.0–40.0)

## 2015-05-28 LAB — PSA: PSA: 1.99 ng/mL (ref 0.10–4.00)

## 2015-05-28 LAB — BASIC METABOLIC PANEL
BUN: 18 mg/dL (ref 6–23)
CO2: 28 mEq/L (ref 19–32)
Calcium: 9.6 mg/dL (ref 8.4–10.5)
Chloride: 105 mEq/L (ref 96–112)
Creatinine, Ser: 1.05 mg/dL (ref 0.40–1.50)
GFR: 77.06 mL/min (ref >60.00)
Glucose, Bld: 88 mg/dL (ref 70–99)
Potassium: 4.4 mEq/L (ref 3.5–5.1)
Sodium: 140 mEq/L (ref 135–145)

## 2015-05-28 LAB — HEPATIC FUNCTION PANEL
ALT: 18 U/L (ref 0–53)
AST: 18 U/L (ref 0–37)
Albumin: 4.1 g/dL (ref 3.5–5.2)
Alkaline Phosphatase: 60 U/L (ref 39–117)
Bilirubin, Direct: 0.2 mg/dL (ref 0.0–0.3)
Total Bilirubin: 0.8 mg/dL (ref 0.2–1.2)
Total Protein: 7.2 g/dL (ref 6.0–8.3)

## 2015-06-04 ENCOUNTER — Ambulatory Visit (INDEPENDENT_AMBULATORY_CARE_PROVIDER_SITE_OTHER): Payer: Managed Care, Other (non HMO) | Admitting: Adult Health

## 2015-06-04 ENCOUNTER — Encounter: Payer: Self-pay | Admitting: Adult Health

## 2015-06-04 VITALS — BP 120/80 | Temp 99.0°F | Ht 70.0 in | Wt 161.7 lb

## 2015-06-04 DIAGNOSIS — Z0000 Encounter for general adult medical examination without abnormal findings: Secondary | ICD-10-CM

## 2015-06-04 DIAGNOSIS — N529 Male erectile dysfunction, unspecified: Secondary | ICD-10-CM | POA: Diagnosis not present

## 2015-06-04 DIAGNOSIS — F329 Major depressive disorder, single episode, unspecified: Secondary | ICD-10-CM | POA: Diagnosis not present

## 2015-06-04 DIAGNOSIS — F32A Depression, unspecified: Secondary | ICD-10-CM

## 2015-06-04 DIAGNOSIS — L821 Other seborrheic keratosis: Secondary | ICD-10-CM | POA: Diagnosis not present

## 2015-06-04 MED ORDER — CITALOPRAM HYDROBROMIDE 10 MG PO TABS: 10.0000 mg | ORAL_TABLET | Freq: Every day | ORAL | Status: DC

## 2015-06-04 MED ORDER — SILDENAFIL CITRATE 50 MG PO TABS: 50.0000 mg | ORAL_TABLET | ORAL | Status: DC | PRN

## 2015-06-04 NOTE — Progress Notes (Signed)
Subjective:    Patient ID: Christopher Leonard, male    DOB: 03-09-1956, 59 y.o.   MRN: [id number]  HPI 59 year old male of Dr. Sherren Mocha who presents to the office for his annual physical. He is very pleasant and healthy. He  has a past medical history of Hepatitis C; Persistent headaches; and Shoulder pain, right.  Depression  - Has been on Celexa in the past for anxiety and depression. He has not been on it January do to insurance reasons and would like to restart it again. He does not currently feel depressed but has some anxiety.   ED - He was written a prescription in the past for Viagra but he never had filled do to him and his girlfriend splitting up. He would like another prescription for Viagra. He has a hard time maintaining an erection.   He does follow up with eye doctor but does not see a dentist. He was encouraged to see a dentist.   All vaccinations up to date.    Review of Systems  Constitutional: Negative.   HENT: Negative.   Eyes: Negative.   Respiratory: Negative.   Cardiovascular: Negative.   Gastrointestinal: Negative.   Endocrine: Negative.   Genitourinary: Negative.   Musculoskeletal: Negative.   Skin:       "Keratosis spots on my face"  Allergic/Immunologic: Negative.   Neurological: Negative.   Hematological: Negative.   Psychiatric/Behavioral: Negative.   All other systems reviewed and are negative.  Past Medical History  Diagnosis Date  . Hepatitis C   . Persistent headaches   . Shoulder pain, right     History   Social History  . Marital Status: Single    Spouse Name: N/A  . Number of Children: N/A  . Years of Education: N/A   Occupational History  . Not on file.   Social History Main Topics  . Smoking status: Former Smoker    Types: Cigarettes    Quit date: 07/18/1982  . Smokeless tobacco: Never Used  . Alcohol Use: No  . Drug Use: No  . Sexual Activity: Not on file   Other Topics Concern  . Not on file   Social History Narrative     Past Surgical History  Procedure Laterality Date  . Inguinal hernia repair      right  . Wisdom tooth extraction      Family History  Problem Relation Age of Onset  . Colon cancer Maternal Grandfather     No Known Allergies  Current Outpatient Prescriptions on File Prior to Visit  Medication Sig Dispense Refill  . Multiple Vitamin (MULTIVITAMIN) tablet Take 1 tablet by mouth daily.    . rizatriptan (MAXALT-MLT) 5 MG disintegrating tablet Take 1 tablet (5 mg total) by mouth as needed for migraine. May repeat in 2 hours if needed 10 tablet 3   No current facility-administered medications on file prior to visit.    BP 120/80 mmHg  Temp(Src) 99 F (37.2 C) (Oral)  Ht 5\' 10"  (1.778 m)  Wt 161 lb 11.2 oz (73.347 kg)  BMI 23.20 kg/m2       Objective:   Physical Exam  Constitutional: He is oriented to person, place, and time. He appears well-developed and well-nourished. No distress.  HENT:  Head: Normocephalic and atraumatic.  Right Ear: External ear normal.  Left Ear: External ear normal.  Nose: Nose normal.  Mouth/Throat: Oropharynx is clear and moist. No oropharyngeal exudate.  Tm's visualized. No cerumen impaction  Eyes: Conjunctivae are normal. Pupils are equal, round, and reactive to light. Right eye exhibits no discharge. Left eye exhibits no discharge. No scleral icterus.  Neck: Normal range of motion. Neck supple.  Cardiovascular: Normal rate, regular rhythm, normal heart sounds and intact distal pulses.  Exam reveals no gallop and no friction rub.   No murmur heard. Pulmonary/Chest: Effort normal and breath sounds normal. No respiratory distress. He has no wheezes. He has no rales. He exhibits no tenderness.  Abdominal: Soft. Bowel sounds are normal. He exhibits no distension and no mass. There is no tenderness. There is no rebound and no guarding.  Genitourinary:  Deferred by patient  Musculoskeletal: Normal range of motion. He exhibits no edema or  tenderness.  Lymphadenopathy:    He has no cervical adenopathy.  Neurological: He is alert and oriented to person, place, and time. He has normal reflexes.  Skin: Skin is warm and dry. No rash noted. He is not diaphoretic. No erythema. No pallor.  seborrheic keratosis with an actinic keratosis on forehead.   Seborrheic keratosis spot on left cheek  Psychiatric: He has a normal mood and affect. His behavior is normal. Judgment and thought content normal.  Nursing note and vitals reviewed.      Assessment & Plan:  1. Routine general medical examination at a health care facility - Continue to exercise and eat healthy  - Follow up in one year for CPE - Follow up as needed  2. Erectile dysfunction, unspecified erectile dysfunction type - sildenafil (VIAGRA) 50 MG tablet; Take 1 tablet (50 mg total) by mouth as needed for erectile dysfunction.  Dispense: 10 tablet; Refill: 11  3. Depression - citalopram (CELEXA) 10 MG tablet; Take 1 tablet (10 mg total) by mouth daily.  Dispense: 90 tablet; Refill: 3  4. Seborrheic keratoses - Would like the two places on his face removed - Ambulatory referral to Dermatology

## 2015-06-04 NOTE — Patient Instructions (Signed)
It was a pleasure meeting you today!   Please continue to exercise and eat healthy.   Dermatology will call you to set up an appointment.   Please let me know if you need anything.

## 2015-09-02 ENCOUNTER — Telehealth: Payer: Self-pay | Admitting: Family Medicine

## 2015-09-02 MED ORDER — SILDENAFIL CITRATE 20 MG PO TABS: 20.0000 mg | ORAL_TABLET | Freq: Every day | ORAL | Status: DC | PRN

## 2015-09-02 NOTE — Telephone Encounter (Signed)
pls advise

## 2015-09-02 NOTE — Telephone Encounter (Signed)
That is fine.   He should follow up with Dr. Sherren Mocha for any additional refills.

## 2015-09-02 NOTE — Telephone Encounter (Signed)
Viagra was far to expensive for patient and he would like a rx for Revatio 20mg  #30 (generic ok) sent to [facility] please.

## 2015-09-02 NOTE — Telephone Encounter (Signed)
Left a message for pt that rx was sent to [facility].

## 2015-09-02 NOTE — Telephone Encounter (Signed)
Rx sent to pharmacy   

## 2016-01-27 ENCOUNTER — Other Ambulatory Visit: Payer: Self-pay | Admitting: Adult Health

## 2016-01-28 NOTE — Telephone Encounter (Signed)
Ok to refill 

## 2016-05-14 DIAGNOSIS — L820 Inflamed seborrheic keratosis: Secondary | ICD-10-CM | POA: Diagnosis not present

## 2016-05-14 DIAGNOSIS — L821 Other seborrheic keratosis: Secondary | ICD-10-CM | POA: Diagnosis not present

## 2016-05-14 DIAGNOSIS — D485 Neoplasm of uncertain behavior of skin: Secondary | ICD-10-CM | POA: Diagnosis not present

## 2016-05-14 DIAGNOSIS — L814 Other melanin hyperpigmentation: Secondary | ICD-10-CM | POA: Diagnosis not present

## 2016-05-14 DIAGNOSIS — D225 Melanocytic nevi of trunk: Secondary | ICD-10-CM | POA: Diagnosis not present

## 2016-08-03 ENCOUNTER — Telehealth: Payer: Self-pay | Admitting: Adult Health

## 2016-08-03 ENCOUNTER — Other Ambulatory Visit: Payer: Self-pay | Admitting: Family Medicine

## 2016-08-03 DIAGNOSIS — F329 Major depressive disorder, single episode, unspecified: Secondary | ICD-10-CM

## 2016-08-03 DIAGNOSIS — F32A Depression, unspecified: Secondary | ICD-10-CM

## 2016-08-03 DIAGNOSIS — G43009 Migraine without aura, not intractable, without status migrainosus: Secondary | ICD-10-CM

## 2016-08-03 NOTE — Telephone Encounter (Signed)
Ok to refill 

## 2016-08-03 NOTE — Telephone Encounter (Signed)
Dr. Sherren Mocha patient. I refilled his medication when I saw him for an acute visit. I am not sure if Dr. Sherren Mocha would like to see him in the office

## 2016-08-06 NOTE — Telephone Encounter (Signed)
Pt has not been seen in the office in the last year. Will verify with Dr.Todd and refill prescription accordingly.

## 2016-08-09 ENCOUNTER — Other Ambulatory Visit: Payer: Self-pay | Admitting: Emergency Medicine

## 2016-08-09 DIAGNOSIS — F329 Major depressive disorder, single episode, unspecified: Secondary | ICD-10-CM

## 2016-08-09 DIAGNOSIS — F32A Depression, unspecified: Secondary | ICD-10-CM

## 2016-08-09 MED ORDER — CITALOPRAM HYDROBROMIDE 10 MG PO TABS: 10.0000 mg | ORAL_TABLET | Freq: Every day | ORAL | 0 refills | Status: DC

## 2016-08-09 NOTE — Telephone Encounter (Signed)
Called and left voicemail for pt to return call to office.  

## 2016-08-09 NOTE — Telephone Encounter (Signed)
Noted  

## 2016-08-09 NOTE — Telephone Encounter (Signed)
Pt aware rx sent to pharmacy for 90 days and to establish with another pcp.  Pt states he will do some research and call back with his decision. I did offer dr Retail banker, cory, dr [place] and Gregary Signs.

## 2016-08-09 NOTE — Telephone Encounter (Signed)
Pt following up on refill request.  Pt would like a call back.

## 2016-08-09 NOTE — Telephone Encounter (Signed)
Per Dr. Sherren Mocha okay to refill for 3 months. Prescription sent to pharmacy. No further refills will be provided pt must establish with new PCP. Called and left message for pt to return call to office.

## 2016-10-26 DIAGNOSIS — H524 Presbyopia: Secondary | ICD-10-CM | POA: Diagnosis not present

## 2016-10-26 DIAGNOSIS — H5213 Myopia, bilateral: Secondary | ICD-10-CM | POA: Diagnosis not present

## 2016-11-03 ENCOUNTER — Other Ambulatory Visit: Payer: Self-pay | Admitting: Family Medicine

## 2016-11-03 DIAGNOSIS — F329 Major depressive disorder, single episode, unspecified: Secondary | ICD-10-CM

## 2016-11-03 DIAGNOSIS — F32A Depression, unspecified: Secondary | ICD-10-CM

## 2016-12-28 ENCOUNTER — Other Ambulatory Visit: Payer: Self-pay | Admitting: Family Medicine

## 2016-12-28 ENCOUNTER — Ambulatory Visit (INDEPENDENT_AMBULATORY_CARE_PROVIDER_SITE_OTHER): Payer: BLUE CROSS/BLUE SHIELD | Admitting: Family Medicine

## 2016-12-28 ENCOUNTER — Encounter: Payer: Self-pay | Admitting: Family Medicine

## 2016-12-28 VITALS — BP 128/88 | HR 69 | Temp 98.6°F | Ht 70.25 in | Wt 174.8 lb

## 2016-12-28 DIAGNOSIS — Z7251 High risk heterosexual behavior: Secondary | ICD-10-CM

## 2016-12-28 DIAGNOSIS — B1710 Acute hepatitis C without hepatic coma: Secondary | ICD-10-CM | POA: Diagnosis not present

## 2016-12-28 DIAGNOSIS — G43009 Migraine without aura, not intractable, without status migrainosus: Secondary | ICD-10-CM

## 2016-12-28 DIAGNOSIS — F325 Major depressive disorder, single episode, in full remission: Secondary | ICD-10-CM

## 2016-12-28 DIAGNOSIS — N529 Male erectile dysfunction, unspecified: Secondary | ICD-10-CM | POA: Insufficient documentation

## 2016-12-28 DIAGNOSIS — Z860101 Personal history of adenomatous and serrated colon polyps: Secondary | ICD-10-CM | POA: Insufficient documentation

## 2016-12-28 DIAGNOSIS — R319 Hematuria, unspecified: Secondary | ICD-10-CM

## 2016-12-28 DIAGNOSIS — Z86010 Personal history of colonic polyps: Secondary | ICD-10-CM | POA: Insufficient documentation

## 2016-12-28 LAB — POC URINALSYSI DIPSTICK (AUTOMATED)
Bilirubin, UA: NEGATIVE
Blood, UA: NEGATIVE
Glucose, UA: NEGATIVE
Ketones, UA: NEGATIVE
Leukocytes, UA: NEGATIVE
Nitrite, UA: NEGATIVE
Protein, UA: NEGATIVE
Spec Grav, UA: 1.02
Urobilinogen, UA: 0.2
pH, UA: 5.5

## 2016-12-28 MED ORDER — CITALOPRAM HYDROBROMIDE 10 MG PO TABS: 10.0000 mg | ORAL_TABLET | Freq: Every day | ORAL | 3 refills | Status: DC

## 2016-12-28 NOTE — Assessment & Plan Note (Signed)
S: depression (but also states some anxiety) are well controlled with celexa 10mg . No SI. PHQ2 of 0. Irritability off of medicin so remains on A/P: refilled celexa as doing well

## 2016-12-28 NOTE — Patient Instructions (Addendum)
Lab before you leave- blood and urine  We will call you within a week or two about your referral to infectious disease. If you do not hear within 3 weeks, give [place] a call.

## 2016-12-28 NOTE — Progress Notes (Signed)
Pre visit review using our clinic review tool, if applicable. No additional management support is needed unless otherwise documented below in the visit note. 

## 2016-12-28 NOTE — Assessment & Plan Note (Signed)
S:Late 70s- likely got from IV drug use. Known about since 66s. Has chosen not to treat in past A/P: Christopher Leonard is now interested in treatment with [facility] potentially. Discussed referral to ID- Christopher Leonard agrees to this. Will get HCV with reflex as do not see recent record of this testing.

## 2016-12-28 NOTE — Assessment & Plan Note (Signed)
S: patient states his headaches have bene well controlled lately with less stress. Currently maxalt rarely- twice yearly A/P: will continue current meds- refill as needed

## 2016-12-28 NOTE — Progress Notes (Signed)
Phone: [phone]  Subjective:  Patient presents today to establish care with me as their new primary care provider. Patient was formerly a patient of Dr. Sherren Mocha. Chief complaint-noted.   See problem oriented charting- ROS- No chest pain or shortness of breath. No headache (recently) or blurry vision.  Denies RUQ pain or jaundice.   The following were reviewed and entered/updated in epic: Past Medical History:  Diagnosis Date  . Hepatitis C   . Persistent headaches   . Shoulder pain, right    torn rotator cuff- putting off   Patient Active Problem List   Diagnosis Date Noted  . HEPATITIS C 09/06/2007    Priority: High  . Migraine headache without aura 09/10/2013    Priority: Medium  . Depression 10/08/2008    Priority: Medium  . History of adenomatous polyp of colon 12/28/2016    Priority: Low  . Erectile dysfunction 12/28/2016    Priority: Low  . HPV in male 03/12/2010    Priority: Low   Past Surgical History:  Procedure Laterality Date  . INGUINAL HERNIA REPAIR     right  . WISDOM TOOTH EXTRACTION      Family History  Problem Relation Age of Onset  . Renal cancer Mother     right nephrectomy. mets to lung- oral meds for that  . Non-Hodgkin's lymphoma Mother     chemo  . Hypertension Mother   . Other Mother     TIA  . Parkinson's disease Father     died age 48. complications from parkinsons. 20 years.   . Arthritis Sister     hip replacement  . Rheumatic fever Brother     cataracts at 44, not sure about heart issues  . Colon cancer Maternal Grandfather   . Pancreatic cancer Sister     doing well post surgery    Medications- reviewed and updated Current Outpatient Prescriptions  Medication Sig Dispense Refill  . citalopram (CELEXA) 10 MG tablet TAKE 1 TABLET (10 MG TOTAL) BY MOUTH DAILY. 90 tablet 0  . Multiple Vitamin (MULTIVITAMIN) tablet Take 1 tablet by mouth daily.    . rizatriptan (MAXALT-MLT) 5 MG disintegrating tablet TAKE 1 TABLET BY MOUTH  ASNEEDED FOR MIGRAINE. MAY REPEAT IN 2HRS IF NEEDED 10 tablet 3  . sildenafil (REVATIO) 20 MG tablet TAKE 1 TABLET (20 MG TOTAL) BY MOUTH DAILY AS NEEDED. 30 tablet 5   Allergies-reviewed and updated No Known Allergies  Social History   Social History  . Marital status: Single    Spouse name: N/A  . Number of children: N/A  . Years of education: N/A   Social History Main Topics  . Smoking status: Former Smoker    Packs/day: 1.00    Years: 10.00    Types: Cigarettes    Quit date: 07/18/1982  . Smokeless tobacco: Never Used  . Alcohol use No     Comment: 26 - last drink. was alcoholic  . Drug use: No  . Sexual activity: Not Asked   Other Topics Concern  . None   Social History Narrative   GF. 1 daughter age 46- in town. No grandkids.       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       Hobbies: rest, white water kayaker in past- shoulder prevents    ROS--See HPI   Objective: BP 128/88 (BP Location: Left Arm, Patient Position: Sitting, Cuff Size: Large)   Pulse 69  Temp 98.6 F (37 C) (Oral)   Ht 5' 10.25" (1.784 m)   Wt 174 lb 12.8 oz (79.3 kg)   SpO2 98%   BMI 24.90 kg/m  Gen: NAD, resting comfortably CV: RRR no murmurs rubs or gallops Lungs: CTAB no crackles, wheeze, rhonchi Abdomen: soft/nontender/nondistended/normal bowel sounds.normal weight  Ext: no edema Skin: warm, dry  Assessment/Plan:  HEPATITIS C S:Late 70s- likely got from IV drug use. Known about since 13s. Has chosen not to treat in past A/P: He is now interested in treatment with [facility] potentially. Discussed referral to ID- he agrees to this. Will get HCV with reflex as do not see recent record of this testing.   Migraine headache without aura S: patient states his headaches have bene well controlled lately with less stress. Currently maxalt rarely- twice yearly A/P: will continue current meds- refill as  needed  Depression S: depression (but also states some anxiety) are well controlled with celexa 10mg . No SI. PHQ2 of 0. Irritability off of medicin so remains on A/P: refilled celexa as doing well  UA- history kidney stones and former smoker. Also trace hematuria dipstick last check. Fortunately none on repeat today  Wants to do 1 year CPE  Orders Placed This Encounter  Procedures  . Hepatitis C antibody, reflex    [facility]  . HIV antibody  . Comprehensive metabolic panel    [place]  . Ambulatory referral to Infectious Disease    Referral Priority:   Routine    Referral Type:   Consultation    Referral Reason:   Specialty Services Required    Requested Specialty:   Infectious Diseases    Number of Visits Requested:   1  . POCT Urinalysis Dipstick (Automated)    Meds ordered this encounter  Medications  . citalopram (CELEXA) 10 MG tablet    Sig: Take 1 tablet (10 mg total) by mouth daily.    Dispense:  100 tablet    Refill:  3    Return precautions advised.   Garret Reddish, MD

## 2016-12-29 LAB — COMPREHENSIVE METABOLIC PANEL
ALT: 75 U/L — ABNORMAL HIGH (ref 0–53)
AST: 38 U/L — ABNORMAL HIGH (ref 0–37)
Albumin: 4.2 g/dL (ref 3.5–5.2)
Alkaline Phosphatase: 63 U/L (ref 39–117)
BUN: 15 mg/dL (ref 6–23)
CO2: 27 mEq/L (ref 19–32)
Calcium: 9.6 mg/dL (ref 8.4–10.5)
Chloride: 105 mEq/L (ref 96–112)
Creatinine, Ser: 0.93 mg/dL (ref 0.40–1.50)
GFR: 88.16 mL/min (ref >60.00)
Glucose, Bld: 81 mg/dL (ref 70–99)
Potassium: 4.1 mEq/L (ref 3.5–5.1)
Sodium: 142 mEq/L (ref 135–145)
Total Bilirubin: 0.6 mg/dL (ref 0.2–1.2)
Total Protein: 7.5 g/dL (ref 6.0–8.3)

## 2016-12-29 LAB — HEPATITIS C ANTIBODY: HCV Ab: REACTIVE — AB

## 2016-12-29 LAB — HIV ANTIBODY (ROUTINE TESTING W REFLEX): HIV 1&2 Ab, 4th Generation: NONREACTIVE

## 2016-12-30 ENCOUNTER — Telehealth: Payer: Self-pay

## 2016-12-30 LAB — HEPATITIS C RNA QUANTITATIVE

## 2016-12-30 NOTE — Telephone Encounter (Signed)
Called patient and left a voicemail message asking for a return phone call. He needs to come back to the office and have his Hepatitis C RNA quantitative redrawn.

## 2016-12-31 ENCOUNTER — Other Ambulatory Visit: Payer: Self-pay

## 2016-12-31 DIAGNOSIS — B1920 Unspecified viral hepatitis C without hepatic coma: Secondary | ICD-10-CM

## 2016-12-31 LAB — HEPATITIS C ANTIBODY: HCV Ab: REACTIVE — AB

## 2016-12-31 LAB — HEPATITIS C RNA QUANTITATIVE

## 2017-01-05 ENCOUNTER — Other Ambulatory Visit (INDEPENDENT_AMBULATORY_CARE_PROVIDER_SITE_OTHER): Payer: BLUE CROSS/BLUE SHIELD

## 2017-01-05 DIAGNOSIS — B1920 Unspecified viral hepatitis C without hepatic coma: Secondary | ICD-10-CM

## 2017-01-08 LAB — HEPATITIS C RNA QUANTITATIVE
HCV Quantitative Log: 5.1 Log IU/mL — ABNORMAL HIGH
HCV Quantitative: 126000 IU/mL — ABNORMAL HIGH

## 2017-02-02 ENCOUNTER — Telehealth: Payer: Self-pay

## 2017-02-02 NOTE — Telephone Encounter (Signed)
Called patient and left a voicemail message that Infectious Disease had tried to contact him to schedule an appointment. I provided him with the following information:  [facility] Infectious disease physician in [place], [place] Address: [address], [place], [place] [address] Phone: [phone]  I asked him to call and schedule an appointment. I did provide a call back number here if he had any questions.

## 2017-02-02 NOTE — Telephone Encounter (Signed)
Thanks for doing this -

## 2017-03-09 ENCOUNTER — Encounter: Payer: Self-pay | Admitting: Internal Medicine

## 2017-03-09 ENCOUNTER — Ambulatory Visit (INDEPENDENT_AMBULATORY_CARE_PROVIDER_SITE_OTHER): Payer: BLUE CROSS/BLUE SHIELD | Admitting: Internal Medicine

## 2017-03-09 VITALS — BP 140/87 | HR 64 | Temp 98.3°F | Ht 70.0 in | Wt 173.0 lb

## 2017-03-09 DIAGNOSIS — Z23 Encounter for immunization: Secondary | ICD-10-CM

## 2017-03-09 DIAGNOSIS — B182 Chronic viral hepatitis C: Secondary | ICD-10-CM

## 2017-03-09 LAB — CBC WITH DIFFERENTIAL/PLATELET
Basophils Absolute: 0 cells/uL (ref 0–200)
Basophils Relative: 0 %
Eosinophils Absolute: 71 cells/uL (ref 15–500)
Eosinophils Relative: 1 %
HCT: 48.9 % (ref 38.5–50.0)
Hemoglobin: 16.4 g/dL (ref 13.2–17.1)
Lymphocytes Relative: 35 %
Lymphs Abs: 2485 cells/uL (ref 850–3900)
MCH: 31.7 pg (ref 27.0–33.0)
MCHC: 33.5 g/dL (ref 32.0–36.0)
MCV: 94.6 fL (ref 80.0–100.0)
MPV: 11.5 fL (ref 7.5–12.5)
Monocytes Absolute: 710 cells/uL (ref 200–950)
Monocytes Relative: 10 %
Neutro Abs: 3834 cells/uL (ref 1500–7800)
Neutrophils Relative %: 54 %
Platelets: 220 10*3/uL (ref 140–400)
RBC: 5.17 MIL/uL (ref 4.20–5.80)
RDW: 13.8 % (ref 11.0–15.0)
WBC: 7.1 10*3/uL (ref 3.8–10.8)

## 2017-03-09 NOTE — Progress Notes (Signed)
[facility] for Infectious Disease   CC: consideration for treatment for chronic hepatitis C  HPI:  +Christopher Leonard is a 61 y.o. male who presents for initial evaluation and management of chronic hepatitis C.  Patient tested positive in the 1990s. Hepatitis C-associated risk factors present are: IV drug abuse (details: used in the 1970s). Patient denies history of blood transfusion, intranasal drug use, tattoos. Patient has had other studies performed. Results: hepatitis C RNA by PCR, result: positive. Patient has not had prior treatment for Hepatitis C. Patient does not have a past history of liver disease. Patient does not have a family history of liver disease. Patient does not  have associated signs or symptoms related to liver disease.  Labs reviewed and confirm chronic hepatitis C with a positive viral load.  No genotype done but he knows it is genotype 1a.   Records reviewed from Epic and he has had elevated LFTs in the past, sometimes wnl.      Patient does not have documented immunity to Hepatitis A. Patient does not have documented immunity to Hepatitis B.    Review of Systems:  Constitutional: positive for fatigue or negative for anorexia Gastrointestinal: negative for diarrhea Integument/breast: negative for rash Musculoskeletal: negative for myalgias and arthralgias All other systems reviewed and are negative       Past Medical History:  Diagnosis Date  . Hepatitis C   . Persistent headaches   . Shoulder pain, right    torn rotator cuff- putting off    Prior to Admission medications   Medication Sig Start Date End Date Taking? Authorizing Provider  citalopram (CELEXA) 10 MG tablet Take 1 tablet (10 mg total) by mouth daily. 12/28/16  Yes Marin Olp, MD  Multiple Vitamin (MULTIVITAMIN) tablet Take 1 tablet by mouth daily.   Yes Historical Provider, MD  rizatriptan (MAXALT-MLT) 5 MG disintegrating tablet TAKE 1 TABLET BY MOUTH ASNEEDED FOR MIGRAINE. MAY REPEAT  IN 2HRS IF NEEDED 08/03/16  Yes Dorena Cookey, MD  sildenafil (REVATIO) 20 MG tablet TAKE 1 TABLET (20 MG TOTAL) BY MOUTH DAILY AS NEEDED. 01/29/16  Yes Dorothyann Peng, NP    No Known Allergies  Social History  Substance Use Topics  . Smoking status: Former Smoker    Packs/day: 1.00    Years: 10.00    Types: Cigarettes    Quit date: 07/18/1982  . Smokeless tobacco: Never Used  . Alcohol use No     Comment: 26 - last drink. was alcoholic    Family History  Problem Relation Age of Onset  . Renal cancer Mother     right nephrectomy. mets to lung- oral meds for that  . Non-Hodgkin's lymphoma Mother     chemo  . Hypertension Mother   . Other Mother     TIA  . Parkinson's disease Father     died age 26. complications from parkinsons. 20 years.   . Arthritis Sister     hip replacement  . Rheumatic fever Brother     cataracts at 44, not sure about heart issues  . Colon cancer Maternal Grandfather   . Pancreatic cancer Sister     doing well post surgery  no cirrhosis, no liver cancer   Objective:  Constitutional: in no apparent distress,  Vitals:   03/09/17 1046  BP: 140/87  Pulse: 64  Temp: 98.3 F (36.8 C)   Eyes: anicteric Cardiovascular: Cor RRR Respiratory: CTA B; normal respiratory effort Gastrointestinal: Bowel sounds are  normal, liver is not enlarged, spleen is not enlarged, soft, nt Musculoskeletal: no pedal edema noted Skin: negatives: no rash; no porphyria cutanea tarda Lymphatic: no cervical lymphadenopathy   Laboratory Genotype:  Lab Results  Component Value Date   HCVGENOTYPE 1a 10/01/2008   HCV viral load:  Lab Results  Component Value Date   HCVQUANT 126,000 (H) 01/05/2017   Lab Results  Component Value Date   WBC 7.0 05/28/2015   HGB 16.4 05/28/2015   HCT 48.2 05/28/2015   MCV 94.1 05/28/2015   PLT 205.0 05/28/2015    Lab Results  Component Value Date   CREATININE 0.93 12/28/2016   BUN 15 12/28/2016   NA 142 12/28/2016   K 4.1  12/28/2016   CL 105 12/28/2016   CO2 27 12/28/2016    Lab Results  Component Value Date   ALT 75 (H) 12/28/2016   AST 38 (H) 12/28/2016   ALKPHOS 63 12/28/2016     Labs and history reviewed and show CHILD-PUGH unknown  5-6 points: Child class A 7-9 points: Child class B 10-15 points: Child class C  Lab Results  Component Value Date   BILITOT 0.6 12/28/2016   ALBUMIN 4.2 12/28/2016     Assessment: New Patient with Chronic Hepatitis C genotype unknown, untreated. Likely genotype 1a I discussed with the patient the lab findings that confirm chronic hepatitis C as well as the natural history and progression of disease including about 30% of people who develop cirrhosis of the liver if left untreated and once cirrhosis is established there is a 2-7% risk per year of liver cancer and liver failure.  I discussed the importance of treatment and benefits in reducing the risk, even if significant liver fibrosis exists.   Plan: 1) Patient counseled extensively on limiting acetaminophen to no more than 2 grams daily, avoidance of alcohol. 2) Transmission discussed with patient including sexual transmission, sharing razors and toothbrush.   3) Will need referral to gastroenterology if concern for cirrhosis 4) Will need referral for substance abuse counseling: No.; Further work up to include urine drug screen  No. 5) Will prescribe appropriate medication based on genotype and coverage; if confirmed 1a, likely will use Harvoni via [facility] due to BCBS  6) Hepatitis A and B titers 7) Pneumovax vaccine today 9) Further work up to include liver staging with elastography 10) will follow up after starting medication

## 2017-03-09 NOTE — Patient Instructions (Signed)
Date 03/09/17  Dear Mr. Galiano, As discussed in the [facility], your hepatitis C therapy will include the following medications:          Harvoni 90mg /400mg  tablet:           Take 1 tablet by mouth once daily   Please note that ALL MEDICATIONS WILL START ON THE SAME DATE for a total of 12 weeks. ---------------------------------------------------------------- Your HCV Treatment Start Date: TBA   Your HCV genotype:  1a    Liver Fibrosis: TBD    ---------------------------------------------------------------- YOUR PHARMACY CONTACT:   [facility] [address] [place], [place] [address] Phone: [phone] Hours: Monday to Friday 7:30 am to 6:00 pm   Please always contact your pharmacy at least 3-4 business days before you run out of medications to ensure your next month's medication is ready or 1 week prior to running out if you receive it by mail.  Remember, each prescription is for 28 days. ---------------------------------------------------------------- GENERAL NOTES REGARDING YOUR HEPATITIS C MEDICATION:  SOFOSBUVIR/LEDIPASVIR (HARVONI): - Harvoni tablet is taken daily with OR without food. - The tablets are orange. - The tablets should be stored at room temperature.  - Acid reducing agents such as H2 blockers (ie. Pepcid (famotidine), Zantac (ranitidine), Tagamet (cimetidine), Axid (nizatidine) and proton pump inhibitors (ie. Prilosec (omeprazole), Protonix (pantoprazole), Nexium (esomeprazole), or Aciphex (rabeprazole)) can decrease effectiveness of Harvoni. Do not take until you have discussed with a health care provider.    -Antacids that contain magnesium and/or aluminum hydroxide (ie. Milk of Magensia, Rolaids, Gaviscon, Maalox, Mylanta, an dArthritis Pain Formula)can reduce absorption of Harvoni, so take them at least 4 hours before or after Harvoni.  -Calcium carbonate (calcium supplements or antacids such as Tums, Caltrate, Os-Cal)needs to be taken at  least 4 hours hours before or after Harvoni.  -St. John's wort or any products that contain St. John's wort like some herbal supplements  Please inform the office prior to starting any of these medications.  - The common side effects associated with Harvoni include:      1. Fatigue      2. Headache      3. Nausea      4. Diarrhea      5. Insomnia  Please note that this only lists the most common side effects and is NOT a comprehensive list of the potential side effects of these medications. For more information, please review the drug information sheets that come with your medication package from the pharmacy.  ---------------------------------------------------------------- GENERAL HELPFUL HINTS ON HCV THERAPY: 1. Stay well-hydrated. 2. Notify the [facility] Clinic of any changes in your other over-the-counter/herbal or prescription medications. 3. If you miss a dose of your medication, take the missed dose as soon as you remember. Return to your regular time/dose schedule the next day.  4.  Do not stop taking your medications without first talking with your healthcare provider. 5.  You may take Tylenol (acetaminophen), as long as the dose is less than 2000 mg (OR no more than 4 tablets of the Tylenol Extra Strengths 500mg  tablet) in 24 hours. 6.  You will see our pharmacist-specialist within the first 2 weeks of starting your medication to monitor for any possible side effects. 7.  You will have labs once during treatment, after soon after treatment completion and one final lab 6 months after treatment completion to verify the virus is out of your system.  Scharlene Gloss, [facility]  [facility] [place], [place]  [id number] [phone]

## 2017-03-10 LAB — COMPLETE METABOLIC PANEL WITH GFR
AG Ratio: 1.2 Ratio (ref 1.0–2.5)
ALT: 288 U/L — ABNORMAL HIGH (ref 9–46)
AST: 140 U/L — ABNORMAL HIGH (ref 10–35)
Albumin: 4.2 g/dL (ref 3.6–5.1)
Alkaline Phosphatase: 61 U/L (ref 40–115)
BUN/Creatinine Ratio: 17.8 Ratio (ref 6–22)
BUN: 19 mg/dL (ref 7–25)
CO2: 25 mmol/L (ref 20–31)
Calcium: 9.8 mg/dL (ref 8.6–10.3)
Chloride: 102 mmol/L (ref 98–110)
Creat: 1.07 mg/dL (ref 0.70–1.33)
GFR, Est African American: 87 mL/min (ref >=60)
GFR, Est Non African American: 76 mL/min (ref >=60)
Globulin: 3.4 g/dL (ref 1.9–3.7)
Glucose, Bld: 92 mg/dL (ref 65–99)
Potassium: 4.4 mmol/L (ref 3.5–5.3)
Sodium: 139 mmol/L (ref 135–146)
Total Bilirubin: 0.7 mg/dL (ref 0.2–1.2)
Total Protein: 7.6 g/dL (ref 6.1–8.1)

## 2017-03-10 LAB — HEPATITIS B SURFACE ANTIGEN: Hepatitis B Surface Ag: NEGATIVE

## 2017-03-10 LAB — PROTIME-INR
INR: 1.1
Prothrombin Time: 11.3 s (ref 9.0–11.5)

## 2017-03-10 LAB — HEPATITIS A ANTIBODY, TOTAL: Hep A Total Ab: NONREACTIVE

## 2017-03-10 LAB — HIV ANTIBODY (ROUTINE TESTING W REFLEX): HIV 1&2 Ab, 4th Generation: NONREACTIVE

## 2017-03-10 LAB — HEPATITIS B CORE ANTIBODY, TOTAL: Hep B Core Total Ab: NONREACTIVE

## 2017-03-10 LAB — HEPATITIS B SURFACE ANTIBODY,QUALITATIVE: Hep B S Ab: NEGATIVE

## 2017-03-10 NOTE — Addendum Note (Signed)
Addended by: Myrtis Hopping A on: 03/10/2017 08:23 AM   Modules accepted: Orders

## 2017-03-12 LAB — LIVER FIBROSIS, FIBROTEST-ACTITEST
ALT: 269 U/L — ABNORMAL HIGH (ref 9–46)
Alpha-2-Macroglobulin: 557 mg/dL — ABNORMAL HIGH (ref 106–279)
Apolipoprotein A1: 118 mg/dL (ref 94–176)
Bilirubin: 0.6 mg/dL (ref 0.2–1.2)
Fibrosis Score: 0.86
GGT: 51 U/L (ref 3–85)
Haptoglobin: 105 mg/dL (ref 43–212)
Necroinflammat ACT Score: 0.94
Reference ID: 1923190

## 2017-03-14 LAB — HEPATITIS C GENOTYPE

## 2017-03-14 LAB — HCV RNA, QN PCR RFLX GENO, LIPA
HCV RNA, PCR, QN: 1620000 IU/mL — ABNORMAL HIGH
HCV RNA, PCR, QN: 6.21 log IU/mL — ABNORMAL HIGH

## 2017-03-15 ENCOUNTER — Other Ambulatory Visit: Payer: Self-pay | Admitting: Internal Medicine

## 2017-03-15 MED ORDER — LEDIPASVIR-SOFOSBUVIR 90-400 MG PO TABS: 1.0000 | ORAL_TABLET | Freq: Every day | ORAL | 2 refills | Status: DC

## 2017-03-16 ENCOUNTER — Ambulatory Visit (HOSPITAL_COMMUNITY)
Admission: RE | Admit: 2017-03-16 | Discharge: 2017-03-16 | Disposition: A | Discharge status: Home / Self Care | Payer: BLUE CROSS/BLUE SHIELD | Source: Ambulatory Visit | Attending: Internal Medicine | Admitting: Internal Medicine

## 2017-03-16 DIAGNOSIS — B182 Chronic viral hepatitis C: Secondary | ICD-10-CM | POA: Diagnosis not present

## 2017-03-16 DIAGNOSIS — N200 Calculus of kidney: Secondary | ICD-10-CM | POA: Insufficient documentation

## 2017-03-16 DIAGNOSIS — B1920 Unspecified viral hepatitis C without hepatic coma: Secondary | ICD-10-CM | POA: Diagnosis not present

## 2017-03-17 ENCOUNTER — Other Ambulatory Visit: Payer: Self-pay | Admitting: Pharmacist

## 2017-03-17 DIAGNOSIS — B182 Chronic viral hepatitis C: Secondary | ICD-10-CM

## 2017-03-17 MED ORDER — LEDIPASVIR-SOFOSBUVIR 90-400 MG PO TABS: 1.0000 | ORAL_TABLET | Freq: Every day | ORAL | 2 refills | Status: DC

## 2017-03-21 ENCOUNTER — Encounter: Payer: Self-pay | Admitting: Pharmacy Technician

## 2017-04-13 ENCOUNTER — Ambulatory Visit: Payer: BLUE CROSS/BLUE SHIELD

## 2017-04-13 ENCOUNTER — Ambulatory Visit (INDEPENDENT_AMBULATORY_CARE_PROVIDER_SITE_OTHER): Payer: BLUE CROSS/BLUE SHIELD | Admitting: Pharmacist

## 2017-04-13 DIAGNOSIS — B182 Chronic viral hepatitis C: Secondary | ICD-10-CM

## 2017-04-13 DIAGNOSIS — Z23 Encounter for immunization: Secondary | ICD-10-CM

## 2017-04-13 NOTE — Progress Notes (Signed)
HPI: Christopher Leonard is a 61 y.o. male who presents to the [facility] for Hep C follow-up. He has genotype 1a, F0/F1 on elastography, and started Harvoni 03/24/17.  Lab Results  Component Value Date   HCVGENOTYPE 1a 03/09/2017    Allergies: No Known Allergies  Past Medical History: Past Medical History:  Diagnosis Date  . Hepatitis C   . Persistent headaches   . Shoulder pain, right    torn rotator cuff- putting off    Social History: Social History   Social History  . Marital status: Single    Spouse name: N/A  . Number of children: N/A  . Years of education: N/A   Social History Main Topics  . Smoking status: Former Smoker    Packs/day: 1.00    Years: 10.00    Types: Cigarettes    Quit date: 07/18/1982  . Smokeless tobacco: Never Used  . Alcohol use No     Comment: 26 - last drink. was alcoholic  . Drug use: No  . Sexual activity: Not on file   Other Topics Concern  . Not on file   Social History Narrative   GF. 1 daughter age 20- in town. No grandkids.       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       Hobbies: rest, white water kayaker in past- shoulder prevents    Labs: Hep B S Ab (no units)  Date Value  03/09/2017 NEG   Hepatitis B Surface Ag (no units)  Date Value  03/09/2017 NEGATIVE   HCV Ab (no units)  Date Value  12/28/2016 REACTIVE (A)  12/28/2016 REACTIVE (A)    Lab Results  Component Value Date   HCVGENOTYPE 1a 03/09/2017    Hepatitis C RNA quantitative Latest Ref Rng & Units 01/05/2017 12/28/2016 12/28/2016 10/01/2008  HCV Quantitative NOT DETECTED IU/mL 126,000(H) CANCELED CANCELED 13600000(H)  HCV Quantitative Log NOT DETECTED Log IU/mL 5.10(H) CANCELED CANCELED -    AST (U/L)  Date Value  03/09/2017 140 (H)  12/28/2016 38 (H)  05/28/2015 18   ALT (U/L)  Date Value  03/09/2017 288 (H)  03/09/2017 269 (H)  12/28/2016 75 (H)  05/28/2015 18   INR (no  units)  Date Value  03/09/2017 1.1    CrCl: CrCl cannot be calculated (Patient's most recent lab result is older than the maximum 21 days allowed.).  Fibrosis Score: F0/F1 as assessed by elastography   Child-Pugh Score: A  Previous Treatment Regimen: None  Assessment: Christopher Leonard is here today for Hep C follow-up.  He started Harvoni several weeks ago on 5/10.  He is not having any issues tolerating Harvoni - just a little fatigued but that is it.  No headaches, nausea, vomiting, or diarrhea.  He has not missed any doses, but does admit to taking one a few hours late. He usually takes it around 7am. He is getting it through [facility] since he has [place].  His fibrosure lab test showed a F4 but his ultrasound showed F0/F1. Platelets and labs are ok. Discussed with Dr. Linus Salmons, patient will be treated as F0/F1.  He does qualify for 2 months of Harvoni but since F4 was submitted to insurance, will treat him for 3.  He is agreeable to this. Will make follow up visits and start his Hep A and Hep B vaccines.    Plans: - Continue Harvoni x 12 weeks - Hep C VL today -  Hep A #1 & Hep B #1 vaccine today - F/u with me 7/9 1:30pm for Hep B #2 - Will make appt for EOT soon  Cassie L. Kuppelweiser, PharmD, [facility] for Infectious Disease 04/13/2017, 3:21 PM

## 2017-04-15 LAB — HEPATITIS C RNA QUANTITATIVE
HCV Quantitative Log: <1.18 Log IU/mL
HCV Quantitative: <15 IU/mL

## 2017-04-25 ENCOUNTER — Telehealth: Payer: Self-pay | Admitting: Pharmacist

## 2017-04-25 NOTE — Telephone Encounter (Signed)
Will have [name] complete the full 12 weeks of Harvoni.  Made an EOT appointment for Christopher Leonard in August. He will come back in July for his Hep B #2 vaccine.

## 2017-05-22 ENCOUNTER — Other Ambulatory Visit: Payer: Self-pay | Admitting: Adult Health

## 2017-05-23 ENCOUNTER — Ambulatory Visit (INDEPENDENT_AMBULATORY_CARE_PROVIDER_SITE_OTHER): Payer: BLUE CROSS/BLUE SHIELD | Admitting: Pharmacist

## 2017-05-23 DIAGNOSIS — Z23 Encounter for immunization: Secondary | ICD-10-CM | POA: Diagnosis not present

## 2017-05-23 DIAGNOSIS — B182 Chronic viral hepatitis C: Secondary | ICD-10-CM

## 2017-05-23 NOTE — Progress Notes (Signed)
HPI: Christopher Leonard is a 61 y.o. male who presents to the [facility] for Hep C follow-up and to get his 2nd Hep B shot. He has genotype 1a, F0/F1 on elastography, and started 12 weeks of Harvoni on 5/10.  Lab Results  Component Value Date   HCVGENOTYPE 1a 03/09/2017    Allergies: No Known Allergies  Past Medical History: Past Medical History:  Diagnosis Date  . Hepatitis C   . Persistent headaches   . Shoulder pain, right    torn rotator cuff- putting off    Social History: Social History   Social History  . Marital status: Single    Spouse name: N/A  . Number of children: N/A  . Years of education: N/A   Social History Main Topics  . Smoking status: Former Smoker    Packs/day: 1.00    Years: 10.00    Types: Cigarettes    Quit date: 07/18/1982  . Smokeless tobacco: Never Used  . Alcohol use No     Comment: 26 - last drink. was alcoholic  . Drug use: No  . Sexual activity: Not on file   Other Topics Concern  . Not on file   Social History Narrative   GF. 1 daughter age 60- in town. No grandkids.       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       Hobbies: rest, white water kayaker in past- shoulder prevents    Labs: Hep B S Ab (no units)  Date Value  03/09/2017 NEG   Hepatitis B Surface Ag (no units)  Date Value  03/09/2017 NEGATIVE   HCV Ab (no units)  Date Value  12/28/2016 REACTIVE (A)  12/28/2016 REACTIVE (A)    Lab Results  Component Value Date   HCVGENOTYPE 1a 03/09/2017    Hepatitis C RNA quantitative Latest Ref Rng & Units 04/13/2017 01/05/2017 12/28/2016 12/28/2016 10/01/2008  HCV Quantitative NOT DETECTED IU/mL <15 NOT DETECTED 126,000(H) CANCELED CANCELED 13600000(H)  HCV Quantitative Log NOT DETECTED Log IU/mL <1.18 NOT DETECTED 5.10(H) CANCELED CANCELED -    AST (U/L)  Date Value  03/09/2017 140 (H)  12/28/2016 38 (H)  05/28/2015 18   ALT (U/L)  Date Value   03/09/2017 288 (H)  03/09/2017 269 (H)  12/28/2016 75 (H)  05/28/2015 18   INR (no units)  Date Value  03/09/2017 1.1    CrCl: CrCl cannot be calculated (Patient's most recent lab result is older than the maximum 21 days allowed.).  Fibrosis Score: F0/F1 as assessed by elastography   Child-Pugh Score: A  Previous Treatment Regimen: None  Assessment: Christopher Leonard is doing well on his Harvoni. He said he just started week 9 of 12.  He has missed 1 dose so far and takes it in the morning usually. He is not having any side effects at this time except for palpitations one day last month that he attributes to anxiety. I told him it most likely wasn't related to the medication. I will give him his 2nd Hep B vaccine today. He will f/u with [place] again next month at EOT.    Plans: - Continue Harvoni x 12 weeks - 2nd Hep B today - F/u EOT 8/8 at 1:30pm - Will need further vaccine appts made next time  [name] [name], PharmD, [facility] for Infectious Disease 05/23/2017, 2:06 PM

## 2017-06-22 ENCOUNTER — Ambulatory Visit (INDEPENDENT_AMBULATORY_CARE_PROVIDER_SITE_OTHER): Payer: BLUE CROSS/BLUE SHIELD | Admitting: Pharmacist Clinician (PhC)/ Clinical Pharmacy Specialist

## 2017-06-22 DIAGNOSIS — B182 Chronic viral hepatitis C: Secondary | ICD-10-CM

## 2017-06-22 NOTE — Progress Notes (Signed)
HPI: Christopher Leonard is a 61 y.o. male who is here for his EOT visit with pharmacy.   Lab Results  Component Value Date   HCVGENOTYPE 1a 03/09/2017    Allergies: No Known Allergies  Vitals:    Past Medical History: Past Medical History:  Diagnosis Date  . Hepatitis C   . Persistent headaches   . Shoulder pain, right    torn rotator cuff- putting off    Social History: Social History   Social History  . Marital status: Single    Spouse name: N/A  . Number of children: N/A  . Years of education: N/A   Social History Main Topics  . Smoking status: Former Smoker    Packs/day: 1.00    Years: 10.00    Types: Cigarettes    Quit date: 07/18/1982  . Smokeless tobacco: Never Used  . Alcohol use No     Comment: 26 - last drink. was alcoholic  . Drug use: No  . Sexual activity: Not on file   Other Topics Concern  . Not on file   Social History Narrative   GF. 1 daughter age 67- in town. No grandkids.       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       Hobbies: rest, white water kayaker in past- shoulder prevents    Labs: Hep B S Ab (no units)  Date Value  03/09/2017 NEG   Hepatitis B Surface Ag (no units)  Date Value  03/09/2017 NEGATIVE   HCV Ab (no units)  Date Value  12/28/2016 REACTIVE (A)  12/28/2016 REACTIVE (A)    Lab Results  Component Value Date   HCVGENOTYPE 1a 03/09/2017    Hepatitis C RNA quantitative Latest Ref Rng & Units 04/13/2017 01/05/2017 12/28/2016 12/28/2016 10/01/2008  HCV Quantitative NOT DETECTED IU/mL <15 NOT DETECTED 126,000(H) CANCELED CANCELED 13600000(H)  HCV Quantitative Log NOT DETECTED Log IU/mL <1.18 NOT DETECTED 5.10(H) CANCELED CANCELED -    AST (U/L)  Date Value  03/09/2017 140 (H)  12/28/2016 38 (H)  05/28/2015 18   ALT (U/L)  Date Value  03/09/2017 288 (H)  03/09/2017 269 (H)  12/28/2016 75 (H)  05/28/2015 18   INR (no units)  Date Value   03/09/2017 1.1    CrCl: CrCl cannot be calculated (Patient's most recent lab result is older than the maximum 21 days allowed.).  Fibrosis Score: F0/1 as assessed by ARFI. F4 by Fibrosure.   Child-Pugh Score: Class A  Previous Treatment Regimen: None  Assessment: Antony Haste finished Harvoni last week for his 12 wks course. He has done very well on the regimen. His first VL came back undetectable so far. We will get the EOT VL today then schedule him for the [facility] then cure visit with pharmacy. He'll need the final hep A/B vaccine at that visit.   Recommendations:  EOT VL today SVR12 in 3 months Cure with with pharmacy and hep A/B vaccines  [place], Pharm.D., BCPS, AAHIVP Clinical Infectious [place] for Infectious Disease 06/22/2017, 2:21 PM

## 2017-06-22 NOTE — Patient Instructions (Signed)
Come back for the cure labs in 3 months then follow up with [place] a week later for the cure visit and hepatitis A/B vaccine

## 2017-06-23 LAB — COMPLETE METABOLIC PANEL WITH GFR
ALT: 12 U/L (ref 9–46)
AST: 14 U/L (ref 10–35)
Albumin: 4.1 g/dL (ref 3.6–5.1)
Alkaline Phosphatase: 61 U/L (ref 40–115)
BUN: 20 mg/dL (ref 7–25)
CO2: 26 mmol/L (ref 20–32)
Calcium: 9.5 mg/dL (ref 8.6–10.3)
Chloride: 105 mmol/L (ref 98–110)
Creat: 1.03 mg/dL (ref 0.70–1.25)
GFR, Est African American: >89 mL/min (ref >=60)
GFR, Est Non African American: 79 mL/min (ref >=60)
Glucose, Bld: 86 mg/dL (ref 65–99)
Potassium: 4.4 mmol/L (ref 3.5–5.3)
Sodium: 140 mmol/L (ref 135–146)
Total Bilirubin: 0.5 mg/dL (ref 0.2–1.2)
Total Protein: 7.1 g/dL (ref 6.1–8.1)

## 2017-06-25 LAB — HEPATITIS C RNA QUANTITATIVE
HCV Quantitative Log: <1.18 Log IU/mL
HCV Quantitative: <15 IU/mL

## 2017-08-22 ENCOUNTER — Encounter: Payer: Self-pay | Admitting: Gastroenterology

## 2017-09-19 ENCOUNTER — Other Ambulatory Visit: Payer: BLUE CROSS/BLUE SHIELD

## 2017-09-19 DIAGNOSIS — B182 Chronic viral hepatitis C: Secondary | ICD-10-CM | POA: Diagnosis not present

## 2017-09-19 LAB — COMPLETE METABOLIC PANEL WITH GFR
AG Ratio: 1.3 (calc) (ref 1.0–2.5)
ALT: 9 U/L (ref 9–46)
AST: 13 U/L (ref 10–35)
Albumin: 4 g/dL (ref 3.6–5.1)
Alkaline phosphatase (APISO): 62 U/L (ref 40–115)
BUN: 22 mg/dL (ref 7–25)
CO2: 30 mmol/L (ref 20–32)
Calcium: 9.9 mg/dL (ref 8.6–10.3)
Chloride: 103 mmol/L (ref 98–110)
Creat: 1.12 mg/dL (ref 0.70–1.25)
GFR, Est African American: 82 mL/min/{1.73_m2} (ref >=60)
GFR, Est Non African American: 71 mL/min/{1.73_m2} (ref >=60)
Globulin: 3 g/dL (calc) (ref 1.9–3.7)
Glucose, Bld: 91 mg/dL (ref 65–99)
Potassium: 4.5 mmol/L (ref 3.5–5.3)
Sodium: 139 mmol/L (ref 135–146)
Total Bilirubin: 0.4 mg/dL (ref 0.2–1.2)
Total Protein: 7 g/dL (ref 6.1–8.1)

## 2017-09-21 LAB — HEPATITIS C RNA QUANTITATIVE
HCV Quantitative Log: <1.18 Log IU/mL
HCV RNA, PCR, QN: <15 IU/mL

## 2017-09-26 ENCOUNTER — Ambulatory Visit (INDEPENDENT_AMBULATORY_CARE_PROVIDER_SITE_OTHER): Payer: BLUE CROSS/BLUE SHIELD | Admitting: Pharmacist Clinician (PhC)/ Clinical Pharmacy Specialist

## 2017-09-26 DIAGNOSIS — Z23 Encounter for immunization: Secondary | ICD-10-CM | POA: Diagnosis not present

## 2017-09-26 DIAGNOSIS — B182 Chronic viral hepatitis C: Secondary | ICD-10-CM

## 2017-09-26 NOTE — Progress Notes (Signed)
HPI: ONOFRIO Leonard is a 61 y.o. male who is here for his final hep A and B vac only  Lab Results  Component Value Date   HCVGENOTYPE 1a 03/09/2017    Allergies: No Known Allergies  Vitals:    Past Medical History: Past Medical History:  Diagnosis Date  . Hepatitis C   . Persistent headaches   . Shoulder pain, right    torn rotator cuff- putting off    Social History: Social History   Socioeconomic History  . Marital status: Single    Spouse name: Not on file  . Number of children: Not on file  . Years of education: Not on file  . Highest education level: Not on file  Social Needs  . Financial resource strain: Not on file  . Food insecurity - worry: Not on file  . Food insecurity - inability: Not on file  . Transportation needs - medical: Not on file  . Transportation needs - non-medical: Not on file  Occupational History  . Not on file  Tobacco Use  . Smoking status: Former Smoker    Packs/day: 1.00    Years: 10.00    Pack years: 10.00    Types: Cigarettes    Last attempt to quit: 07/18/1982    Years since quitting: 35.2  . Smokeless tobacco: Never Used  Substance and Sexual Activity  . Alcohol use: No    Comment: 26 - last drink. was alcoholic  . Drug use: No  . Sexual activity: Not on file  Other Topics Concern  . Not on file  Social History Narrative   GF. 1 daughter age 41- in town. No grandkids.       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       Hobbies: rest, white water kayaker in past- shoulder prevents    Labs: Hep B S Ab (no units)  Date Value  03/09/2017 NEG   Hepatitis B Surface Ag (no units)  Date Value  03/09/2017 NEGATIVE   HCV Ab (no units)  Date Value  12/28/2016 REACTIVE (A)  12/28/2016 REACTIVE (A)    Lab Results  Component Value Date   HCVGENOTYPE 1a 03/09/2017    Hepatitis C RNA quantitative Latest Ref Rng & Units 09/19/2017 06/22/2017 04/13/2017 01/05/2017  12/28/2016  HCV Quantitative NOT DETECTED IU/mL - <15 NOT DETECTED <15 NOT DETECTED 126,000(H) CANCELED  HCV Quantitative Log NOT DETECT Log IU/mL <1.18 NOT DETECTED <1.18 NOT DETECTED <1.18 NOT DETECTED 5.10(H) CANCELED    AST (U/L)  Date Value  09/19/2017 13  06/22/2017 14  03/09/2017 140 (H)   ALT (U/L)  Date Value  09/19/2017 9  06/22/2017 12  03/09/2017 288 (H)  03/09/2017 269 (H)   INR (no units)  Date Value  03/09/2017 1.1    CrCl: CrCl cannot be calculated (Unknown ideal weight.).  Fibrosis Score: F0/1 as assessed by ARFI  Child-Pugh Score: Class A  Previous Treatment Regimen: None  Assessment: Christopher Leonard is now cured of his hep C. He is just her for his final hep A/B. He complained of some soreness in the vaccine but other than that, he is doing well. His Fibrosure showed F4 but the elastography only showed minimal fibrosis. Plt>150k. He will not need to f/u with [place] anymore.   Recommendations: Final Hep A/B Cured of hep C  [place], Pharm.D., BCPS, AAHIVP Clinical Infectious [facility] for Infectious Disease 09/26/2017, 4:21 PM

## 2017-11-01 DIAGNOSIS — H524 Presbyopia: Secondary | ICD-10-CM | POA: Diagnosis not present

## 2017-11-01 DIAGNOSIS — H5213 Myopia, bilateral: Secondary | ICD-10-CM | POA: Diagnosis not present

## 2017-11-22 ENCOUNTER — Encounter: Payer: Self-pay | Admitting: Gastroenterology

## 2018-01-17 ENCOUNTER — Other Ambulatory Visit: Payer: Self-pay

## 2018-01-17 ENCOUNTER — Ambulatory Visit (AMBULATORY_SURGERY_CENTER): Payer: Self-pay

## 2018-01-17 VITALS — Ht 70.0 in | Wt 174.8 lb

## 2018-01-17 DIAGNOSIS — Z860100 Personal history of colon polyps, unspecified: Secondary | ICD-10-CM

## 2018-01-17 MED ORDER — PEG 3350-KCL-NA BICARB-NACL 420 G PO SOLR: 4000.0000 mL | Freq: Once | ORAL | 0 refills | Status: AC

## 2018-01-17 NOTE — Progress Notes (Signed)
Denies allergies to eggs or soy products. Denies complication of anesthesia or sedation. Denies use of weight loss medication. Denies use of O2.   Emmi instructions declined.  

## 2018-01-20 ENCOUNTER — Encounter: Payer: Self-pay | Admitting: Gastroenterology

## 2018-01-27 ENCOUNTER — Encounter: Payer: Self-pay | Admitting: Gastroenterology

## 2018-01-27 ENCOUNTER — Ambulatory Visit (AMBULATORY_SURGERY_CENTER): Payer: BLUE CROSS/BLUE SHIELD | Admitting: Gastroenterology

## 2018-01-27 ENCOUNTER — Other Ambulatory Visit: Payer: Self-pay

## 2018-01-27 VITALS — BP 116/78 | HR 59 | Temp 98.6°F | Resp 11 | Ht 70.0 in | Wt 174.0 lb

## 2018-01-27 DIAGNOSIS — D125 Benign neoplasm of sigmoid colon: Secondary | ICD-10-CM | POA: Diagnosis not present

## 2018-01-27 DIAGNOSIS — D123 Benign neoplasm of transverse colon: Secondary | ICD-10-CM | POA: Diagnosis not present

## 2018-01-27 DIAGNOSIS — Z86010 Personal history of colonic polyps: Secondary | ICD-10-CM

## 2018-01-27 DIAGNOSIS — D122 Benign neoplasm of ascending colon: Secondary | ICD-10-CM

## 2018-01-27 MED ORDER — SODIUM CHLORIDE 0.9 % IV SOLN: 500.0000 mL | Freq: Once | INTRAVENOUS | Status: DC

## 2018-01-27 NOTE — Progress Notes (Signed)
A/ox3 pleased with MAC, report to [facility]

## 2018-01-27 NOTE — Progress Notes (Signed)
Pt's states no medical or surgical changes since previsit or office visit. 

## 2018-01-27 NOTE — Patient Instructions (Signed)
Impression/recommendations:  Polyps (handout given)  YOU HAD AN ENDOSCOPIC PROCEDURE TODAY AT THE [facility]:   Refer to the procedure report that was given to you for any specific questions about what was found during the examination.  If the procedure report does not answer your questions, please call your gastroenterologist to clarify.  If you requested that your care partner not be given the details of your procedure findings, then the procedure report has been included in a sealed envelope for you to review at your convenience later.  YOU SHOULD EXPECT: Some feelings of bloating in the abdomen. Passage of more gas than usual.  Walking can help get rid of the air that was put into your GI tract during the procedure and reduce the bloating. If you had a lower endoscopy (such as a colonoscopy or flexible sigmoidoscopy) you may notice spotting of blood in your stool or on the toilet paper. If you underwent a bowel prep for your procedure, you may not have a normal bowel movement for a few days.  Please Note:  You might notice some irritation and congestion in your nose or some drainage.  This is from the oxygen used during your procedure.  There is no need for concern and it should clear up in a day or so.  SYMPTOMS TO REPORT IMMEDIATELY:   Following lower endoscopy (colonoscopy or flexible sigmoidoscopy):  Excessive amounts of blood in the stool  Significant tenderness or worsening of abdominal pains  Swelling of the abdomen that is new, acute  Fever of 100F or higher  For urgent or emergent issues, a gastroenterologist can be reached at any hour by calling [phone].   DIET:  We do recommend a small meal at first, but then you may proceed to your regular diet.  Drink plenty of fluids but you should avoid alcoholic beverages for 24 hours.  ACTIVITY:  You should plan to take it easy for the rest of today and you should NOT DRIVE or use heavy machinery until tomorrow  (because of the sedation medicines used during the test).    FOLLOW UP: Our staff will call the number listed on your records the next business day following your procedure to check on you and address any questions or concerns that you may have regarding the information given to you following your procedure. If we do not reach you, we will leave a message.  However, if you are feeling well and you are not experiencing any problems, there is no need to return our call.  We will assume that you have returned to your regular daily activities without incident.  If any biopsies were taken you will be contacted by phone or by letter within the next 1-3 weeks.  Please call us at [phone] if you have not heard about the biopsies in 3 weeks.    SIGNATURES/CONFIDENTIALITY: You and/or your care partner have signed paperwork which will be entered into your electronic medical record.  These signatures attest to the fact that that the information above on your After Visit Summary has been reviewed and is understood.  Full responsibility of the confidentiality of this discharge information lies with you and/or your care-partner. 

## 2018-01-27 NOTE — Progress Notes (Signed)
Called to room to assist during endoscopic procedure.  Patient ID and intended procedure confirmed with present staff. Received instructions for my participation in the procedure from the performing physician.  

## 2018-01-27 NOTE — Op Note (Signed)
Christopher Leonard Patient Name: Christopher Leonard Procedure Date: 01/27/2018 8:28 AM MRN: [id number] Endoscopist: Milus Banister , MD Age: 62 Referring MD:  Date of Birth: December 18, 1955 Gender: Male Account #: [id number] Procedure:                Colonoscopy Indications:              High risk colon cancer surveillance: Personal                            history of colonic polyps; 2008 colonoscopy one                            subCM adenoma, 2013 one subCM SSA Medicines:                Monitored Anesthesia Care Procedure:                Pre-Anesthesia Assessment:                           - Prior to the procedure, a History and Physical                            was performed, and patient medications and                            allergies were reviewed. The patient's tolerance of                            previous anesthesia was also reviewed. The risks                            and benefits of the procedure and the sedation                            options and risks were discussed with the patient.                            All questions were answered, and informed consent                            was obtained. Prior Anticoagulants: The patient has                            taken no previous anticoagulant or antiplatelet                            agents. ASA Grade Assessment: II - A patient with                            mild systemic disease. After reviewing the risks                            and benefits, the patient was deemed in  satisfactory condition to undergo the procedure.                           After obtaining informed consent, the colonoscope                            was passed under direct vision. Throughout the                            procedure, the patient's blood pressure, pulse, and                            oxygen saturations were monitored continuously. The                            Colonoscope was introduced through  the anus and                            advanced to the the cecum, identified by                            appendiceal orifice and ileocecal valve. The                            colonoscopy was performed without difficulty. The                            patient tolerated the procedure well. The quality                            of the bowel preparation was excellent. The                            ileocecal valve, appendiceal orifice, and rectum                            were photographed. Scope In: 8:39:18 AM Scope Out: 9:00:07 AM Scope Withdrawal Time: 0 hours 17 minutes 12 seconds  Total Procedure Duration: 0 hours 20 minutes 49 seconds  Findings:                 A 12 mm polyp was found in the ascending colon. The                            polyp was sessile. The polyp was removed with a hot                            snare. Resection and retrieval were complete.                           Four sessile polyps were found in the sigmoid colon                            and transverse colon. The polyps were 2 to  4 mm in                            size. These polyps were removed with a cold snare.                            Resection and retrieval were complete.                           The exam was otherwise without abnormality on                            direct and retroflexion views. Complications:            No immediate complications. Estimated blood loss:                            None. Estimated Blood Loss:     Estimated blood loss: none. Impression:               - One 12 mm polyp in the ascending colon, removed                            with a hot snare. Resected and retrieved.                           - Four 2 to 4 mm polyps in the sigmoid colon and in                            the transverse colon, removed with a cold snare.                            Resected and retrieved.                           - The examination was otherwise normal on direct                             and retroflexion views. Recommendation:           - Patient has a contact number available for                            emergencies. The signs and symptoms of potential                            delayed complications were discussed with the                            patient. Return to normal activities tomorrow.                            Written discharge instructions were provided to the  patient.                           - Resume previous diet.                           - Continue present medications.                           You will receive a letter within 2-3 weeks with the                            pathology results and my final recommendations.                           If the polyp(s) is proven to be 'pre-cancerous' on                            pathology, you will need repeat colonoscopy in 3-5                            years. If the polyp(s) is NOT 'precancerous' on                            pathology then you should repeat colon cancer                            screening in 10 years with colonoscopy without need                            for colon cancer screening by any method prior to                            then (including stool testing). Milus Banister, MD 01/27/2018 9:04:56 AM This report has been signed electronically.

## 2018-01-30 ENCOUNTER — Telehealth: Payer: Self-pay

## 2018-01-30 NOTE — Telephone Encounter (Signed)
  Follow up Call-  Call back number 01/27/2018  Post procedure Call Back phone  # [phone]  Permission to leave phone message Yes  Some recent data might be hidden     Patient questions:  Do you have a fever, pain , or abdominal swelling? No. Pain Score  0 *  Have you tolerated food without any problems? Yes.    Have you been able to return to your normal activities? Yes.    Do you have any questions about your discharge instructions: Diet   No. Medications  No. Follow up visit  No.  Do you have questions or concerns about your Care? No.  Actions: * If pain score is 4 or above: No action needed, pain <4.

## 2018-02-01 ENCOUNTER — Encounter: Payer: Self-pay | Admitting: Gastroenterology

## 2018-03-20 ENCOUNTER — Other Ambulatory Visit: Payer: Self-pay | Admitting: Family Medicine

## 2018-03-27 ENCOUNTER — Other Ambulatory Visit: Payer: Self-pay | Admitting: Family Medicine

## 2018-04-14 ENCOUNTER — Telehealth: Payer: Self-pay | Admitting: *Deleted

## 2018-04-14 ENCOUNTER — Other Ambulatory Visit: Payer: Self-pay

## 2018-04-14 MED ORDER — CITALOPRAM HYDROBROMIDE 10 MG PO TABS
10.0000 mg | ORAL_TABLET | Freq: Every day | ORAL | 0 refills | Status: DC
Start: 2018-04-14 — End: 2018-06-08

## 2018-04-14 NOTE — Telephone Encounter (Signed)
Copied from [place] [phone]. Topic: Inquiry >> Apr 14, 2018  7:42 AM Arletha Grippe wrote: Reason for CRM: pt is wanting to know why he he only got a 30 day refill on his medication.  He wants to know if an appt is needed and if so, why that was not clearly communicated to him.  Please  Cb is [phone]   >> Apr 14, 2018  7:47 AM Arletha Grippe wrote: I offered to make appt, pt declined said that he was not sick, and didn't deserve to be penalized for being healthy.

## 2018-04-14 NOTE — Telephone Encounter (Signed)
Called and left a voicemail message asking for a return phone call 

## 2018-04-14 NOTE — Telephone Encounter (Signed)
Spoke with patient and scheduled him for a physical in July. Sent in enough meds to get to appointment

## 2018-06-08 ENCOUNTER — Ambulatory Visit (INDEPENDENT_AMBULATORY_CARE_PROVIDER_SITE_OTHER): Payer: BLUE CROSS/BLUE SHIELD | Admitting: Family Medicine

## 2018-06-08 ENCOUNTER — Encounter: Payer: Self-pay | Admitting: Family Medicine

## 2018-06-08 VITALS — BP 122/84 | HR 54 | Temp 98.2°F | Ht 70.0 in | Wt 176.8 lb

## 2018-06-08 DIAGNOSIS — L989 Disorder of the skin and subcutaneous tissue, unspecified: Secondary | ICD-10-CM

## 2018-06-08 DIAGNOSIS — B182 Chronic viral hepatitis C: Secondary | ICD-10-CM | POA: Diagnosis not present

## 2018-06-08 DIAGNOSIS — B351 Tinea unguium: Secondary | ICD-10-CM

## 2018-06-08 DIAGNOSIS — G43009 Migraine without aura, not intractable, without status migrainosus: Secondary | ICD-10-CM

## 2018-06-08 DIAGNOSIS — F325 Major depressive disorder, single episode, in full remission: Secondary | ICD-10-CM | POA: Diagnosis not present

## 2018-06-08 DIAGNOSIS — Z125 Encounter for screening for malignant neoplasm of prostate: Secondary | ICD-10-CM | POA: Diagnosis not present

## 2018-06-08 DIAGNOSIS — N5201 Erectile dysfunction due to arterial insufficiency: Secondary | ICD-10-CM

## 2018-06-08 DIAGNOSIS — E785 Hyperlipidemia, unspecified: Secondary | ICD-10-CM

## 2018-06-08 DIAGNOSIS — Z0000 Encounter for general adult medical examination without abnormal findings: Secondary | ICD-10-CM | POA: Diagnosis not present

## 2018-06-08 LAB — POC URINALSYSI DIPSTICK (AUTOMATED)
Bilirubin, UA: NEGATIVE
Blood, UA: NEGATIVE
Glucose, UA: NEGATIVE
Ketones, UA: NEGATIVE
Leukocytes, UA: NEGATIVE
Nitrite, UA: NEGATIVE
Protein, UA: NEGATIVE
Spec Grav, UA: 1.02 (ref 1.010–1.025)
Urobilinogen, UA: 0.2 E.U./dL
pH, UA: 6 (ref 5.0–8.0)

## 2018-06-08 LAB — CBC
HCT: 45.6 % (ref 39.0–52.0)
Hemoglobin: 15.7 g/dL (ref 13.0–17.0)
MCHC: 34.5 g/dL (ref 30.0–36.0)
MCV: 93.6 fl (ref 78.0–100.0)
Platelets: 213 10*3/uL (ref 150.0–400.0)
RBC: 4.87 Mil/uL (ref 4.22–5.81)
RDW: 13.1 % (ref 11.5–15.5)
WBC: 7.4 10*3/uL (ref 4.0–10.5)

## 2018-06-08 LAB — COMPREHENSIVE METABOLIC PANEL
ALT: 9 U/L (ref 0–53)
AST: 10 U/L (ref 0–37)
Albumin: 4.2 g/dL (ref 3.5–5.2)
Alkaline Phosphatase: 59 U/L (ref 39–117)
BUN: 19 mg/dL (ref 6–23)
CO2: 30 mEq/L (ref 19–32)
Calcium: 9.6 mg/dL (ref 8.4–10.5)
Chloride: 101 mEq/L (ref 96–112)
Creatinine, Ser: 1.05 mg/dL (ref 0.40–1.50)
GFR: 76.27 mL/min (ref >60.00)
Glucose, Bld: 90 mg/dL (ref 70–99)
Potassium: 4.1 mEq/L (ref 3.5–5.1)
Sodium: 137 mEq/L (ref 135–145)
Total Bilirubin: 0.5 mg/dL (ref 0.2–1.2)
Total Protein: 7.2 g/dL (ref 6.0–8.3)

## 2018-06-08 LAB — LIPID PANEL
Cholesterol: 169 mg/dL (ref 0–200)
HDL: 35.1 mg/dL — ABNORMAL LOW (ref >39.00)
LDL Cholesterol: 102 mg/dL — ABNORMAL HIGH (ref 0–99)
NonHDL: 134
Total CHOL/HDL Ratio: 5
Triglycerides: 162 mg/dL — ABNORMAL HIGH (ref 0.0–149.0)
VLDL: 32.4 mg/dL (ref 0.0–40.0)

## 2018-06-08 LAB — PSA: PSA: 2.65 ng/mL (ref 0.10–4.00)

## 2018-06-08 MED ORDER — CITALOPRAM HYDROBROMIDE 10 MG PO TABS: 10.0000 mg | ORAL_TABLET | Freq: Every day | ORAL | 3 refills | Status: DC

## 2018-06-08 MED ORDER — TAVABOROLE 5 % EX SOLN: CUTANEOUS | 11 refills | Status: DC

## 2018-06-08 MED ORDER — SILDENAFIL CITRATE 20 MG PO TABS: ORAL_TABLET | ORAL | 4 refills | Status: DC

## 2018-06-08 NOTE — Addendum Note (Signed)
Addended by: Kayren Eaves T on: 06/08/2018 03:10 PM   Modules accepted: Orders

## 2018-06-08 NOTE — Patient Instructions (Addendum)
Please stop by lab before you go  Hopefully Kerydin affordable and effective  Hope the extra help at work can help you get down a few more ours so you can take some more time for you for exercise  We did not order HCV labs- if they dont have you in for follow up- we can do that next year to be on safe side.

## 2018-06-08 NOTE — Assessment & Plan Note (Signed)
HCV - We had referred to ID last year - he is cured of HCV after treatment. tmonitor HCV RNA discussed- he prefers to do this with ID- thinks he has 1 final follow up. Monitor LFTs. If they dont see him back we will get HCV RNA next year if not.

## 2018-06-08 NOTE — Assessment & Plan Note (Signed)
Had topical treatment for onychomycosis through dermatology- worked as long as really consistent. Now 3rd and 4th toe affected right foot (didn't go away completely. 3rd toe on left foot. kerydin/vavaborole topical. May need PA - I prefer this med due to HCV history

## 2018-06-08 NOTE — Assessment & Plan Note (Signed)
Migraines- maxalt prn . Has not had to use maxalt in the last year. He states doesn't even need refill.

## 2018-06-08 NOTE — Assessment & Plan Note (Signed)
ED- able to do ok with just 1 revatio- he usually just needs this for second ejaculation. Some sinus swelling with this and may get slight headache but not migraine

## 2018-06-08 NOTE — Assessment & Plan Note (Signed)
phq9 AND gad7 <5 each. Continue celexa 10mg .   Work related. Emotional exhaustion. Low energy lately. Some days just hits a wall at work. Better with exercise. Working 50 hard hours a week- we discussed working toward lowering his work hours closer to 40 if possible. 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

## 2018-06-08 NOTE — Progress Notes (Signed)
Phone: [phone]  Subjective:  Patient presents today for their annual physical. Chief complaint-noted.   See problem oriented charting- ROS- full  review of systems was completed and negative except for: exhaustion during work with extended hours. No chest pain or shortness of breath  The following were reviewed and entered/updated in epic: Past Medical History:  Diagnosis Date  . Anxiety   . Depression   . Hepatitis C   . Persistent headaches   . Shoulder pain, right    torn rotator cuff- putting off  . Substance abuse [facility])    Patient Active Problem List   Diagnosis Date Noted  . Chronic hepatitis C without hepatic coma ([place]) 09/06/2007    Priority: High  . Migraine headache without aura 09/10/2013    Priority: Medium  . Depression 10/08/2008    Priority: Medium  . History of adenomatous polyp of colon 12/28/2016    Priority: Low  . Erectile dysfunction 12/28/2016    Priority: Low  . HPV in male 03/12/2010    Priority: Low  . Onychomycosis 06/08/2018   Past Surgical History:  Procedure Laterality Date  . COLONOSCOPY    . INGUINAL HERNIA REPAIR     right  . POLYPECTOMY    . WISDOM TOOTH EXTRACTION      Family History  Problem Relation Age of Onset  . Renal cancer Mother        right nephrectomy. mets to lung- oral meds for that  . Non-Hodgkin's lymphoma Mother        chemo  . Hypertension Mother   . Other Mother        TIA  . Parkinson's disease Father        died age 29. complications from parkinsons. 20 years.   . Arthritis Sister        hip replacement  . Rheumatic fever Brother        cataracts at 8, not sure about heart issues  . Colon cancer Maternal Grandfather   . Pancreatic cancer Sister        doing well post surgery  . Esophageal cancer Neg Hx   . Liver cancer Neg Hx   . Rectal cancer Neg Hx   . Stomach cancer Neg Hx     Medications- reviewed and updated Current Outpatient Medications  Medication Sig Dispense Refill  .  citalopram (CELEXA) 10 MG tablet Take 1 tablet (10 mg total) by mouth daily. 90 tablet 3  . ibuprofen (ADVIL,MOTRIN) 800 MG tablet Take 800 mg by mouth every 8 (eight) hours as needed.    . Multiple Vitamin (MULTIVITAMIN) tablet Take 1 tablet by mouth daily.    . rizatriptan (MAXALT-MLT) 5 MG disintegrating tablet TAKE 1 TABLET BY MOUTH ASNEEDED FOR MIGRAINE. MAY REPEAT IN 2HRS IF NEEDED 10 tablet 3  . sildenafil (REVATIO) 20 MG tablet TAKE 1 TABLET (20 MG TOTAL) BY MOUTH DAILY AS NEEDED. 30 tablet 4  . Tavaborole 5 % SOLN Apply topically to affected nails for 48 weeks 10 mL 11   No current facility-administered medications for this visit.     Allergies-reviewed and updated No Known Allergies  Social History   Social History Narrative   GF. 1 daughter age 19- in town. No grandkids.       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       Hobbies: rest, white water kayaker in past- shoulder prevents    Objective: BP  122/84 (BP Location: Left Arm, Patient Position: Sitting, Cuff Size: Large)   Pulse (!) 54   Temp 98.2 F (36.8 C) (Oral)   Ht _0  (1.778 m)   Wt 176 lb 12.8 oz (80.2 kg)   SpO2 98%   BMI 25.37 kg/m  Gen: NAD, resting comfortably HEENT: Mucous membranes are moist. Oropharynx normal Neck: no thyromegaly CV: RRR no murmurs rubs or gallops Lungs: CTAB no crackles, wheeze, rhonchi Abdomen: soft/nontender/nondistended/normal bowel sounds. No rebound or guarding.  Ext: no edema Skin: warm, dry Neuro: grossly normal, moves all extremities, PERRLA  Declines rectal for now   Assessment/Plan:  62 y.o. male presenting for annual physical.  Health Maintenance counseling: 1. Anticipatory guidance: Patient counseled regarding regular dental exams - doesn't see dentist- encouraged q6 months- he states no issues and wants to continue prn follow up, eye exams -yearly Dr. Gershon Crane, wearing seatbelts.  2. Risk  factor reduction:  Advised patient of need for regular exercise and diet rich and fruits and vegetables to reduce risk of heart attack and stroke. Exercise- mainly on weekends not working. Diet-reasonable weight.  Wt Readings from Last 3 Encounters:  06/08/18 176 lb 12.8 oz (80.2 kg)  01/27/18 174 lb (78.9 kg)  01/17/18 174 lb 12.8 oz (79.3 kg)  3. Immunizations/screenings/ancillary studies- up to date Immunization History  Administered Date(s) Administered  . Hepatitis A, Adult 04/13/2017, 09/26/2017  . Hepatitis B, adult 04/13/2017, 05/23/2017, 09/26/2017  . Influenza Whole 08/15/2009  . Influenza-Unspecified 08/18/2016  . Pneumococcal Polysaccharide-23 03/09/2017  . Td 11/15/2001  . Tdap 05/30/2012  4. Prostate cancer screening-  Trend PSA today. Rectal declined- he knows he has some BPH- some dribbling already known.  Lab Results  Component Value Date   PSA 1.99 05/28/2015   PSA 1.53 05/23/2012   PSA 1.73 05/21/2010   5. Colon cancer screening - 01/27/18 with 3 year repeat 6. Skin cancer screening- refer to dermatology. advised regular sunscreen use. Denies worrisome, changing, or new skin lesions.   Status of chronic or acute concerns   Chronic hepatitis C without hepatic coma (HCC) HCV - We had referred to ID last year - he is cured of HCV after treatment. tmonitor HCV RNA discussed- he prefers to do this with ID- thinks he has 1 final follow up. Monitor LFTs. If they dont see him back we will get HCV RNA next year if not.   Migraine headache without aura Migraines- maxalt prn . Has not had to use maxalt in the last year. He states doesn't even need refill.    Erectile dysfunction ED- able to do ok with just 1 revatio- he usually just needs this for second ejaculation. Some sinus swelling with this and may get slight headache but not migraine  Depression phq9 AND gad7 <5 each. Continue celexa 35m.   Work related. Emotional exhaustion. Low energy lately. Some days just hits  a wall at work. Better with exercise. Working 50 hard hours a week- we discussed working toward lowering his work hours closer to 40 if possible. 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 Now 3rd and 4th toe affected right foot (didn't go away completely. 3rd toe on left foot. kerydin/vavaborole topical. May need PA - I prefer this med due to HCV history   Return in about 1 year (around 06/09/2019) for physical.  Lab/Order associations:  Chronic hepatitis C without hepatic coma (HCC) - Plan: Lipid Profile, CBC, Comp Met (CMET), POCT Urinalysis Dipstick (Automated)  Migraine headache without aura  Hyperlipidemia, unspecified hyperlipidemia type - Plan: Lipid Profile, CBC, Comp Met (CMET), POCT Urinalysis Dipstick (Automated)  Skin lesion of face  Lesion of subcutaneous tissue - Plan: Ambulatory referral to Dermatology  Screening for prostate cancer - Plan: PSA  Erectile dysfunction due to arterial insufficiency  Major depressive disorder with single episode, in full remission ([place])  Onychomycosis  Meds ordered this encounter  Medications  . sildenafil (REVATIO) 20 MG tablet    Sig: TAKE 1 TABLET (20 MG TOTAL) BY MOUTH DAILY AS NEEDED.    Dispense:  30 tablet    Refill:  4  . citalopram (CELEXA) 10 MG tablet    Sig: Take 1 tablet (10 mg total) by mouth daily.    Dispense:  90 tablet    Refill:  3  . Tavaborole 5 % SOLN    Sig: Apply topically to affected nails for 48 weeks    Dispense:  10 mL    Refill:  11    Return precautions advised.  Garret Reddish, MD

## 2018-06-15 ENCOUNTER — Telehealth: Payer: Self-pay

## 2018-06-15 NOTE — Telephone Encounter (Signed)
Prior Authorization submitted for Tavaborole 5 % SOLN. Pending approval at this time

## 2018-06-23 ENCOUNTER — Telehealth: Payer: Self-pay

## 2018-06-23 NOTE — Telephone Encounter (Signed)
Received a Prior Authorization denial for Tavaborole 5 % SOLN. Please advise

## 2018-06-23 NOTE — Telephone Encounter (Signed)
Let him know denied. He may want to return to dermatology- he was helped there in the past and they may know ways to get insurance to improve drug.

## 2018-06-26 NOTE — Telephone Encounter (Signed)
Called patient and left a detailed VM about PA being denied and to go see dermatology that they might can get it approved there.

## 2018-11-24 DIAGNOSIS — L570 Actinic keratosis: Secondary | ICD-10-CM | POA: Diagnosis not present

## 2018-11-24 DIAGNOSIS — B351 Tinea unguium: Secondary | ICD-10-CM | POA: Diagnosis not present

## 2018-11-24 DIAGNOSIS — D170 Benign lipomatous neoplasm of skin and subcutaneous tissue of head, face and neck: Secondary | ICD-10-CM | POA: Diagnosis not present

## 2018-11-24 DIAGNOSIS — L814 Other melanin hyperpigmentation: Secondary | ICD-10-CM | POA: Diagnosis not present

## 2018-11-24 DIAGNOSIS — L720 Epidermal cyst: Secondary | ICD-10-CM | POA: Diagnosis not present

## 2018-11-24 DIAGNOSIS — C44519 Basal cell carcinoma of skin of other part of trunk: Secondary | ICD-10-CM | POA: Diagnosis not present

## 2018-12-15 ENCOUNTER — Encounter: Payer: Self-pay | Admitting: Plastic Surgery

## 2018-12-15 DIAGNOSIS — Z79899 Other long term (current) drug therapy: Secondary | ICD-10-CM | POA: Diagnosis not present

## 2018-12-15 DIAGNOSIS — B351 Tinea unguium: Secondary | ICD-10-CM | POA: Diagnosis not present

## 2019-03-15 ENCOUNTER — Encounter: Payer: Self-pay | Admitting: Plastic Surgery

## 2019-03-15 ENCOUNTER — Ambulatory Visit (INDEPENDENT_AMBULATORY_CARE_PROVIDER_SITE_OTHER): Payer: BLUE CROSS/BLUE SHIELD | Admitting: Plastic Surgery

## 2019-03-15 ENCOUNTER — Other Ambulatory Visit: Payer: Self-pay

## 2019-03-15 VITALS — BP 127/85 | HR 64 | Temp 98.5°F | Ht 70.0 in | Wt 172.0 lb

## 2019-03-15 DIAGNOSIS — D170 Benign lipomatous neoplasm of skin and subcutaneous tissue of head, face and neck: Secondary | ICD-10-CM

## 2019-03-15 NOTE — Progress Notes (Signed)
Patient ID: Christopher Leonard, male    DOB: [birth year], 63 y.o.   MRN: [id number]   Chief Complaint  Patient presents with  . Skin Problem    The patient is a 63 year old male here for evaluation of a mass on his forehead.  The patient states it is been present for at least 6 months.  It seems to be getting larger.  It is 2 x 2 cm in size.  It is just to the left of the midline middle of his forehead.  He does not have any history of skin cancer in that area.  He was seen by the dermatologist he was concerned about the area and sent him to see me.  He is otherwise in good health.  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  Nothing makes the area better.  He is concerned with the increase in size.  The overlying skin looks normal and healthy.  It is a little bit soft but not movable.  It does not feel like an osteoma.   Review of Systems  Constitutional: Negative.  Negative for activity change.  HENT: Negative.   Eyes: Negative.   Respiratory: Negative.  Negative for chest tightness and shortness of breath.   Cardiovascular: Negative.   Gastrointestinal: Negative.  Negative for abdominal pain.  Endocrine: Negative.   Genitourinary: Negative.   Musculoskeletal: Negative.   Skin: Positive for color change.  Hematological: Negative.     Past Medical History:  Diagnosis Date  . Anxiety   . Depression   . Hepatitis C   . Persistent headaches   . Shoulder pain, right    torn rotator cuff- putting off  . Substance abuse [facility])     Past Surgical History:  Procedure Laterality Date  . COLONOSCOPY    . INGUINAL HERNIA REPAIR     right  . POLYPECTOMY    . WISDOM TOOTH EXTRACTION        Current Outpatient Medications:  .  citalopram (CELEXA) 10 MG tablet, Take 1 tablet (10 mg total) by mouth daily., Disp: 90 tablet, Rfl: 3 .  ibuprofen (ADVIL,MOTRIN) 800 MG tablet, Take 800 mg by mouth every 8 (eight) hours as needed., Disp: , Rfl:  .   Multiple Vitamin (MULTIVITAMIN) tablet, Take 1 tablet by mouth daily., Disp: , Rfl:  .  rizatriptan (MAXALT-MLT) 5 MG disintegrating tablet, TAKE 1 TABLET BY MOUTH ASNEEDED FOR MIGRAINE. MAY REPEAT IN 2HRS IF NEEDED, Disp: 10 tablet, Rfl: 3 .  sildenafil (REVATIO) 20 MG tablet, TAKE 1 TABLET (20 MG TOTAL) BY MOUTH DAILY AS NEEDED., Disp: 30 tablet, Rfl: 4 .  Tavaborole 5 % SOLN, Apply topically to affected nails for 48 weeks (Patient not taking: Reported on 03/15/2019), Disp: 10 mL, Rfl: 11   Objective:   Vitals:   03/15/19 1004  BP: 127/85  Pulse: 64  Temp: 98.5 F (36.9 C)  SpO2: 99%    Physical Exam Vitals signs and nursing note reviewed.  Constitutional:      Appearance: Normal appearance.  HENT:     Head: Normocephalic and atraumatic.      Nose: Nose normal.     Mouth/Throat:     Mouth: Mucous membranes are moist.  Cardiovascular:     Rate and Rhythm: Normal rate.  Pulmonary:     Effort: Pulmonary effort is normal. No respiratory distress.     Breath sounds: No wheezing.  Abdominal:  General: Abdomen is flat. There is no distension.     Tenderness: There is no abdominal tenderness.  Skin:    General: Skin is warm.  Neurological:     General: No focal deficit present.     Mental Status: He is alert and oriented to person, place, and time.  Psychiatric:        Mood and Affect: Mood normal.        Behavior: Behavior normal.     Assessment & Plan:  Lipoma of face Recommend excision of forehead lipoma.  We did discuss risks and complications including severe bruising for few days and nerve injury.  Picture taken and placed in chart with patient permission.  [place], DO

## 2019-03-16 ENCOUNTER — Institutional Professional Consult (permissible substitution): Payer: BLUE CROSS/BLUE SHIELD | Admitting: Plastic Surgery

## 2019-04-30 ENCOUNTER — Telehealth: Payer: Self-pay | Admitting: Plastic Surgery

## 2019-04-30 NOTE — Telephone Encounter (Signed)

## 2019-05-01 ENCOUNTER — Other Ambulatory Visit: Payer: Self-pay

## 2019-05-01 ENCOUNTER — Ambulatory Visit (INDEPENDENT_AMBULATORY_CARE_PROVIDER_SITE_OTHER): Payer: BC Managed Care – PPO | Admitting: Plastic Surgery

## 2019-05-01 ENCOUNTER — Encounter: Payer: Self-pay | Admitting: Plastic Surgery

## 2019-05-01 ENCOUNTER — Other Ambulatory Visit (HOSPITAL_COMMUNITY)
Admission: RE | Admit: 2019-05-01 | Discharge: 2019-05-01 | Disposition: A | Discharge status: Home / Self Care | Payer: BC Managed Care – PPO | Source: Ambulatory Visit | Attending: Plastic Surgery | Admitting: Plastic Surgery

## 2019-05-01 VITALS — BP 139/81 | HR 64 | Temp 98.3°F | Ht 70.0 in | Wt 173.0 lb

## 2019-05-01 DIAGNOSIS — D170 Benign lipomatous neoplasm of skin and subcutaneous tissue of head, face and neck: Secondary | ICD-10-CM | POA: Insufficient documentation

## 2019-05-01 NOTE — Progress Notes (Signed)
Preoperative Dx: mass of forehead  Postoperative Dx: Same  Procedure: excision of 2.5 x 2.5 mass forehead  Surgeon: Dr. Lyndee Leo Shacola Schussler  Anesthesia: Lidocaine 1% with 1:100,000 epinepherine   Description of Procedure: Risks and complications were explained to the patient .  Consent was confirmed.  Time out was called and all information was confirmed to be correct.  The area was prepped with betadine and drapped.  Lidocaine 1% with epinepherine was injected in the subcutaneous area.  After waiting several minutes for the lidocaine to take affect a #15 blade was used to incise the skin over the lesion.  The scissors and bovie were used to dissect to the 2.5 x 2.5 cm lesion.  The bovie was used to free the lesion that appeared to be a lipoma.  It was removed completely.  A 5-0 Monocryl was used to close the skin edges with simple interrupted and vertical mattress sutures.  Steri strips were applied.  The patient is to follow up in one week.  He tolerated the procedure well and there were no complications. The specimen was sent to pathology.

## 2019-05-09 ENCOUNTER — Encounter: Payer: BC Managed Care – PPO | Admitting: Plastic Surgery

## 2019-06-12 ENCOUNTER — Encounter: Payer: BLUE CROSS/BLUE SHIELD | Admitting: Family Medicine

## 2019-06-20 ENCOUNTER — Encounter: Payer: Self-pay | Admitting: Family Medicine

## 2019-06-20 ENCOUNTER — Other Ambulatory Visit: Payer: Self-pay

## 2019-06-20 ENCOUNTER — Ambulatory Visit (INDEPENDENT_AMBULATORY_CARE_PROVIDER_SITE_OTHER): Payer: BC Managed Care – PPO | Admitting: Family Medicine

## 2019-06-20 VITALS — BP 120/86 | HR 56 | Temp 98.2°F | Ht 70.0 in | Wt 171.0 lb

## 2019-06-20 DIAGNOSIS — R002 Palpitations: Secondary | ICD-10-CM

## 2019-06-20 DIAGNOSIS — B351 Tinea unguium: Secondary | ICD-10-CM

## 2019-06-20 DIAGNOSIS — E785 Hyperlipidemia, unspecified: Secondary | ICD-10-CM

## 2019-06-20 DIAGNOSIS — B182 Chronic viral hepatitis C: Secondary | ICD-10-CM | POA: Diagnosis not present

## 2019-06-20 DIAGNOSIS — Z0000 Encounter for general adult medical examination without abnormal findings: Secondary | ICD-10-CM

## 2019-06-20 DIAGNOSIS — G43009 Migraine without aura, not intractable, without status migrainosus: Secondary | ICD-10-CM

## 2019-06-20 DIAGNOSIS — D170 Benign lipomatous neoplasm of skin and subcutaneous tissue of head, face and neck: Secondary | ICD-10-CM

## 2019-06-20 DIAGNOSIS — F325 Major depressive disorder, single episode, in full remission: Secondary | ICD-10-CM

## 2019-06-20 DIAGNOSIS — Z86010 Personal history of colonic polyps: Secondary | ICD-10-CM

## 2019-06-20 DIAGNOSIS — Z860101 Personal history of adenomatous and serrated colon polyps: Secondary | ICD-10-CM

## 2019-06-20 DIAGNOSIS — Z125 Encounter for screening for malignant neoplasm of prostate: Secondary | ICD-10-CM | POA: Diagnosis not present

## 2019-06-20 LAB — CBC
HCT: 48.2 % (ref 39.0–52.0)
Hemoglobin: 16.4 g/dL (ref 13.0–17.0)
MCHC: 34.1 g/dL (ref 30.0–36.0)
MCV: 94.8 fl (ref 78.0–100.0)
Platelets: 211 10*3/uL (ref 150.0–400.0)
RBC: 5.08 Mil/uL (ref 4.22–5.81)
RDW: 13.2 % (ref 11.5–15.5)
WBC: 6.2 10*3/uL (ref 4.0–10.5)

## 2019-06-20 LAB — POC URINALSYSI DIPSTICK (AUTOMATED)
Bilirubin, UA: NEGATIVE
Blood, UA: NEGATIVE
Glucose, UA: NEGATIVE
Ketones, UA: NEGATIVE
Leukocytes, UA: NEGATIVE
Nitrite, UA: NEGATIVE
Protein, UA: NEGATIVE
Spec Grav, UA: 1.02 (ref 1.010–1.025)
Urobilinogen, UA: 0.2 E.U./dL
pH, UA: 6 (ref 5.0–8.0)

## 2019-06-20 LAB — COMPREHENSIVE METABOLIC PANEL
ALT: 10 U/L (ref 0–53)
AST: 11 U/L (ref 0–37)
Albumin: 4.4 g/dL (ref 3.5–5.2)
Alkaline Phosphatase: 66 U/L (ref 39–117)
BUN: 17 mg/dL (ref 6–23)
CO2: 30 mEq/L (ref 19–32)
Calcium: 9.8 mg/dL (ref 8.4–10.5)
Chloride: 103 mEq/L (ref 96–112)
Creatinine, Ser: 0.97 mg/dL (ref 0.40–1.50)
GFR: 78.36 mL/min (ref >60.00)
Glucose, Bld: 95 mg/dL (ref 70–99)
Potassium: 4.4 mEq/L (ref 3.5–5.1)
Sodium: 138 mEq/L (ref 135–145)
Total Bilirubin: 0.6 mg/dL (ref 0.2–1.2)
Total Protein: 7.3 g/dL (ref 6.0–8.3)

## 2019-06-20 LAB — PSA: PSA: 2.72 ng/mL (ref 0.10–4.00)

## 2019-06-20 LAB — LIPID PANEL
Cholesterol: 181 mg/dL (ref 0–200)
HDL: 44.1 mg/dL (ref >39.00)
LDL Cholesterol: 123 mg/dL — ABNORMAL HIGH (ref 0–99)
NonHDL: 136.95
Total CHOL/HDL Ratio: 4
Triglycerides: 69 mg/dL (ref 0.0–149.0)
VLDL: 13.8 mg/dL (ref 0.0–40.0)

## 2019-06-20 LAB — HM HEPATITIS C SCREENING LAB: HM Hepatitis Screen: NEGATIVE

## 2019-06-20 MED ORDER — TADALAFIL 5 MG PO TABS: 5.0000 mg | ORAL_TABLET | Freq: Every day | ORAL | 11 refills | Status: DC

## 2019-06-20 NOTE — Addendum Note (Signed)
Addended by: Francis Dowse T on: 06/20/2019 09:11 AM   Modules accepted: Orders

## 2019-06-20 NOTE — Patient Instructions (Addendum)
Health Maintenance Due  Topic Date Due  . INFLUENZA VACCINE-please let us know when you get the flu shot at work this year 06/16/2019    Please stop by lab before you go If you do not have mychart- we will call you about results within 5 business days of [place] receiving them.  If you have mychart- we will send your results within 3 business days of [place] receiving them.  If abnormal or we want to clarify a result, we will call or mychart you to make sure you receive the message.  If you have questions or concerns or don't hear within 5-7 days, please send [place] a message or call us.    EKG today-let us know if you have new or worsening symptoms with the palpitations

## 2019-06-20 NOTE — Progress Notes (Signed)
Phone: [phone]   Subjective:  Patient presents today for their annual physical. Chief complaint-noted.   See problem oriented charting- ROS- full  review of systems was completed and negative except for: Palpitations, constipation, difficulty urinating, back pain, dizziness  The following were reviewed and entered/updated in epic: Past Medical History:  Diagnosis Date  . Anxiety   . Depression   . Hepatitis C   . Persistent headaches   . Shoulder pain, right    torn rotator cuff- putting off  . Substance abuse [facility])    Patient Active Problem List   Diagnosis Date Noted  . Chronic hepatitis C without hepatic coma ([place]) 09/06/2007    Priority: High  . Migraine headache without aura 09/10/2013    Priority: Medium  . Depression 10/08/2008    Priority: Medium  . History of adenomatous polyp of colon 12/28/2016    Priority: Low  . Erectile dysfunction 12/28/2016    Priority: Low  . HPV in male 03/12/2010    Priority: Low  . Hyperlipidemia 06/20/2019  . Lipoma of face 03/15/2019  . Onychomycosis 06/08/2018   Past Surgical History:  Procedure Laterality Date  . COLONOSCOPY    . INGUINAL HERNIA REPAIR     right  . POLYPECTOMY    . WISDOM TOOTH EXTRACTION      Family History  Problem Relation Age of Onset  . Renal cancer Mother        right nephrectomy. mets to lung- oral meds for that  . Non-Hodgkin's lymphoma Mother        chemo  . Hypertension Mother   . Other Mother        TIA  . Parkinson's disease Father        died age 77. complications from parkinsons. 20 years.   . Arthritis Sister        hip replacement  . Rheumatic fever Brother        cataracts at 17, not sure about heart issues  . Colon cancer Maternal Grandfather   . Pancreatic cancer Sister        doing well post surgery  . Esophageal cancer Neg Hx   . Liver cancer Neg Hx   . Rectal cancer Neg Hx   . Stomach cancer Neg Hx     Medications- reviewed and updated Current Outpatient  Medications  Medication Sig Dispense Refill  . citalopram (CELEXA) 10 MG tablet Take 1 tablet (10 mg total) by mouth daily. 90 tablet 3  . ibuprofen (ADVIL,MOTRIN) 800 MG tablet Take 800 mg by mouth every 8 (eight) hours as needed.    . Multiple Vitamin (MULTIVITAMIN) tablet Take 1 tablet by mouth daily.    . Omega-3 1000 MG CAPS Take by mouth.    . rizatriptan (MAXALT-MLT) 5 MG disintegrating tablet TAKE 1 TABLET BY MOUTH ASNEEDED FOR MIGRAINE. MAY REPEAT IN 2HRS IF NEEDED 10 tablet 3  . sildenafil (REVATIO) 20 MG tablet TAKE 1 TABLET (20 MG TOTAL) BY MOUTH DAILY AS NEEDED. 30 tablet 4   No current facility-administered medications for this visit.     Allergies-reviewed and updated No Known Allergies  Social History   Social History Narrative   GF. 1 daughter age 59- in town. No grandkids.       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       Hobbies: rest, white water kayaker in past- shoulder prevents   Objective  Objective:  BP 120/86 (  BP Location: Left Arm, Patient Position: Sitting, Cuff Size: Normal)   Pulse (!) 56   Temp 98.2 F (36.8 C) (Oral)   Ht 5\' 10"  (1.778 m)   Wt 171 lb (77.6 kg)   SpO2 98%   BMI 24.54 kg/m  Gen: NAD, resting comfortably HEENT: Mucous membranes are moist. Oropharynx normal other than 2 papules noted on the tongue- biopsy have been recommended by dentistry Neck: no obvious thyromegaly or cervical lymphadenopathy CV: RRR no murmurs rubs or gallops Lungs: CTAB no crackles, wheeze, rhonchi Abdomen: soft/nontender/nondistended/normal bowel sounds. No rebound or guarding.  Ext: no edema Skin: warm, dry Neuro: grossly normal, moves all extremities, PERRLA MSK: Good range of motion of the hips but some pain with internal rotation  EKG: sinus bradycardia with rate 50, normal axis, normal intervals, no hypertrophy, no st or t wave chages    Assessment and Plan  63 y.o. male  presenting for annual physical.  Health Maintenance counseling: 1. Anticipatory guidance: Patient counseled regarding regular dental exams -q6 months advised - got new dentist, eye exams- with Dr. Gershon Crane yearly,  avoiding smoking and second hand smoke , limiting alcohol to 2 beverages per day - 0 alcohol- recovered alcoholic and no drinks in 36 years.   2. Risk factor reduction:  Advised patient of need for regular exercise and diet rich and fruits and vegetables to reduce risk of heart attack and stroke. Exercise- walks 2 miles each day with dog and doing rowing machine- not going to gym due to covid 19. Diet- reasonably healthy diet.  Wt Readings from Last 3 Encounters:  06/20/19 171 lb (77.6 kg)  05/01/19 173 lb (78.5 kg)  03/15/19 172 lb (78 kg)  3. Immunizations/screenings/ancillary studies-recommended fall flu shot.  We also discussed Shingrix-defers for now (not sure if he had chicken pox) with covid 19  Immunization History  Administered Date(s) Administered  . Hepatitis A, Adult 04/13/2017, 09/26/2017  . Hepatitis B, adult 04/13/2017, 05/23/2017, 09/26/2017  . Influenza Whole 08/15/2009  . Influenza-Unspecified 08/18/2016  . Pneumococcal Polysaccharide-23 03/09/2017  . Td 11/15/2001  . Tdap 05/30/2012  4. Prostate cancer screening- some BPH on prior exams.  Still with some dribbling. Getting up more frequently at night- 1-2x a night.  We will trend PSA-did have a slight uptake last year. Wants to see if cialis would be covered for BPH- if not will use flomax.  Lab Results  Component Value Date   PSA 2.65 06/08/2018   PSA 1.99 05/28/2015   PSA 1.53 05/23/2012   5. Colon cancer screening - January 17, 2018 with 3-year repeat  Due to adenomas 6. Skin cancer screening-referred to dermatology last year. Lipoma on face removed by plastic surgery ultimately. advised regular sunscreen use. Denies worrisome, changing, or new skin lesions.  7.  Former smoker-quit in the 1980s.  Candidate for  AAA screening at age 41  Status of chronic or acute concerns   Depression  - Taking Citalopram 10 mg daily.  PHQ 9-not elevated-continues on full remission.  A lot of weight from work last year-doing better at present - they got more help with new NP and very helpful.   Chronic hepatitis C- patient was seen by infectious disease in 2018 and is considered cured after treatment.  We will monitor hepatitis C RNA.  Check LFTs as well.  Migraine - Taking Maxalt prn. No recent migraines- 1 in last 2 years  Erectile Dysfunction - Taking Sildenafil 20 mg prn. Will try cialis instead  Palpitations-  transient episodes of fluttering. Felt like may be missing a beat when checked pulse during an episode. Happening about once a week. He would like an updated EKG- offered cardiac monitoring but defers for now- he thinks may have been anxiety related. No dizziness with episodes/vertigo.   Has had some unrelated issues with vertigo from the palpitations. Reports having severe episode 2 years ago. Meclizine helped in past and went away this time when used. Only 2 episodes in his life.   Constipation within last few weeks- did start yogurt on daily basis a month ago and doing fair amount of ice cream- wonders if that's the cause. No blood in stool. We opted to monitor/shift diet back slightly.   BPH- see above  Some intermittent low back pain.  Some hip pain that radiates into the groin- mild pain with internal rotation of the right hip-suspect underlying arthritis.  Has 2 small tongue lesions which dentist recommended biopsy of by oral surgeon-encouraged patient to follow-up   Recommended follow up: 1 year physical   Lab/Order associations:Fasting   ICD-10-CM   1. Preventative health care  Z00.00   2. Chronic hepatitis C without hepatic coma (HCC)  B18.2 Comprehensive metabolic panel    HCV RNA quant  3. Hyperlipidemia, unspecified hyperlipidemia type  E78.5 CBC    Comprehensive metabolic panel     Lipid panel    POCT Urinalysis Dipstick (Automated)  4. Migraine without aura and without status migrainosus, not intractable  G43.009   5. Major depressive disorder with single episode, in full remission ([place])  F32.5   6. History of adenomatous polyp of colon  Z86.010   7. Screening for prostate cancer  Z12.5 PSA    No orders of the defined types were placed in this encounter.   Return precautions advised.  Garret Reddish, MD

## 2019-06-22 ENCOUNTER — Encounter: Payer: Self-pay | Admitting: Family Medicine

## 2019-06-22 LAB — HCV RNA QUANT: Hepatitis C Quantitation: NOT DETECTED IU/mL

## 2019-07-30 ENCOUNTER — Other Ambulatory Visit: Payer: Self-pay | Admitting: Family Medicine

## 2019-08-18 DIAGNOSIS — Z20828 Contact with and (suspected) exposure to other viral communicable diseases: Secondary | ICD-10-CM | POA: Diagnosis not present

## 2019-09-06 ENCOUNTER — Telehealth: Payer: Self-pay

## 2019-09-06 NOTE — Telephone Encounter (Signed)
Copied from [place] [id number]. Topic: General - Other >> Sep 06, 2019  3:31 PM Sheran Luz wrote: Patient calling to report Aug 16, 2019. Ppd test done same day and covid test on 10/3, both negative.

## 2019-09-06 NOTE — Telephone Encounter (Signed)
Noted  

## 2019-09-07 ENCOUNTER — Other Ambulatory Visit: Payer: Self-pay

## 2019-09-07 DIAGNOSIS — Z20822 Contact with and (suspected) exposure to covid-19: Secondary | ICD-10-CM

## 2019-09-08 LAB — NOVEL CORONAVIRUS, NAA: SARS-CoV-2, NAA: NOT DETECTED

## 2019-09-25 ENCOUNTER — Other Ambulatory Visit: Payer: Self-pay

## 2019-09-25 DIAGNOSIS — Z20822 Contact with and (suspected) exposure to covid-19: Secondary | ICD-10-CM

## 2019-09-28 LAB — NOVEL CORONAVIRUS, NAA: SARS-CoV-2, NAA: NOT DETECTED

## 2019-10-18 ENCOUNTER — Other Ambulatory Visit: Payer: Self-pay

## 2019-10-18 DIAGNOSIS — Z20822 Contact with and (suspected) exposure to covid-19: Secondary | ICD-10-CM

## 2019-10-21 LAB — NOVEL CORONAVIRUS, NAA: SARS-CoV-2, NAA: NOT DETECTED

## 2019-10-31 ENCOUNTER — Other Ambulatory Visit: Payer: Self-pay

## 2019-10-31 ENCOUNTER — Ambulatory Visit: Payer: BC Managed Care – PPO | Attending: Internal Medicine

## 2019-10-31 DIAGNOSIS — Z20828 Contact with and (suspected) exposure to other viral communicable diseases: Secondary | ICD-10-CM | POA: Diagnosis not present

## 2019-10-31 DIAGNOSIS — Z20822 Contact with and (suspected) exposure to covid-19: Secondary | ICD-10-CM

## 2019-11-02 LAB — NOVEL CORONAVIRUS, NAA: SARS-CoV-2, NAA: NOT DETECTED

## 2019-11-07 DIAGNOSIS — M25511 Pain in right shoulder: Secondary | ICD-10-CM | POA: Diagnosis not present

## 2019-11-14 ENCOUNTER — Ambulatory Visit: Payer: BC Managed Care – PPO | Attending: Internal Medicine

## 2019-11-14 DIAGNOSIS — Z20822 Contact with and (suspected) exposure to covid-19: Secondary | ICD-10-CM

## 2019-11-14 DIAGNOSIS — Z20828 Contact with and (suspected) exposure to other viral communicable diseases: Secondary | ICD-10-CM | POA: Diagnosis not present

## 2019-11-16 LAB — NOVEL CORONAVIRUS, NAA: SARS-CoV-2, NAA: NOT DETECTED

## 2019-11-28 ENCOUNTER — Ambulatory Visit: Payer: BC Managed Care – PPO | Attending: Internal Medicine

## 2019-11-28 DIAGNOSIS — Z20822 Contact with and (suspected) exposure to covid-19: Secondary | ICD-10-CM | POA: Diagnosis not present

## 2019-11-29 DIAGNOSIS — M25511 Pain in right shoulder: Secondary | ICD-10-CM | POA: Diagnosis not present

## 2019-11-30 LAB — NOVEL CORONAVIRUS, NAA: SARS-CoV-2, NAA: NOT DETECTED

## 2019-12-06 DIAGNOSIS — M25511 Pain in right shoulder: Secondary | ICD-10-CM | POA: Diagnosis not present

## 2019-12-06 DIAGNOSIS — M75121 Complete rotator cuff tear or rupture of right shoulder, not specified as traumatic: Secondary | ICD-10-CM | POA: Diagnosis not present

## 2019-12-11 DIAGNOSIS — M25511 Pain in right shoulder: Secondary | ICD-10-CM | POA: Diagnosis not present

## 2019-12-12 ENCOUNTER — Ambulatory Visit: Payer: BC Managed Care – PPO | Attending: Internal Medicine

## 2019-12-12 DIAGNOSIS — Z20822 Contact with and (suspected) exposure to covid-19: Secondary | ICD-10-CM | POA: Diagnosis not present

## 2019-12-13 ENCOUNTER — Other Ambulatory Visit: Payer: BC Managed Care – PPO

## 2019-12-13 LAB — NOVEL CORONAVIRUS, NAA: SARS-CoV-2, NAA: NOT DETECTED

## 2019-12-24 DIAGNOSIS — M25511 Pain in right shoulder: Secondary | ICD-10-CM | POA: Diagnosis not present

## 2019-12-26 ENCOUNTER — Ambulatory Visit: Payer: BC Managed Care – PPO | Attending: Internal Medicine

## 2019-12-26 DIAGNOSIS — Z20822 Contact with and (suspected) exposure to covid-19: Secondary | ICD-10-CM | POA: Diagnosis not present

## 2019-12-27 LAB — NOVEL CORONAVIRUS, NAA: SARS-CoV-2, NAA: NOT DETECTED

## 2020-02-06 ENCOUNTER — Other Ambulatory Visit: Payer: Self-pay | Admitting: Family Medicine

## 2020-02-11 ENCOUNTER — Encounter: Payer: Self-pay | Admitting: Family Medicine

## 2020-02-11 ENCOUNTER — Ambulatory Visit (INDEPENDENT_AMBULATORY_CARE_PROVIDER_SITE_OTHER): Payer: BC Managed Care – PPO | Admitting: Family Medicine

## 2020-02-11 ENCOUNTER — Other Ambulatory Visit: Payer: Self-pay

## 2020-02-11 VITALS — BP 126/78 | HR 55 | Temp 98.6°F | Ht 70.0 in | Wt 166.0 lb

## 2020-02-11 DIAGNOSIS — E785 Hyperlipidemia, unspecified: Secondary | ICD-10-CM

## 2020-02-11 DIAGNOSIS — Z01818 Encounter for other preprocedural examination: Secondary | ICD-10-CM | POA: Diagnosis not present

## 2020-02-11 DIAGNOSIS — R350 Frequency of micturition: Secondary | ICD-10-CM

## 2020-02-11 DIAGNOSIS — N400 Enlarged prostate without lower urinary tract symptoms: Secondary | ICD-10-CM | POA: Insufficient documentation

## 2020-02-11 DIAGNOSIS — F325 Major depressive disorder, single episode, in full remission: Secondary | ICD-10-CM | POA: Diagnosis not present

## 2020-02-11 DIAGNOSIS — G43009 Migraine without aura, not intractable, without status migrainosus: Secondary | ICD-10-CM

## 2020-02-11 DIAGNOSIS — N401 Enlarged prostate with lower urinary tract symptoms: Secondary | ICD-10-CM

## 2020-02-11 DIAGNOSIS — N5201 Erectile dysfunction due to arterial insufficiency: Secondary | ICD-10-CM

## 2020-02-11 LAB — CBC WITH DIFFERENTIAL/PLATELET
Basophils Absolute: 0 10*3/uL (ref 0.0–0.1)
Basophils Relative: 0.5 % (ref 0.0–3.0)
Eosinophils Absolute: 0.1 10*3/uL (ref 0.0–0.7)
Eosinophils Relative: 0.9 % (ref 0.0–5.0)
HCT: 45.1 % (ref 39.0–52.0)
Hemoglobin: 15.4 g/dL (ref 13.0–17.0)
Lymphocytes Relative: 31.2 % (ref 12.0–46.0)
Lymphs Abs: 2.3 10*3/uL (ref 0.7–4.0)
MCHC: 34.2 g/dL (ref 30.0–36.0)
MCV: 94.5 fl (ref 78.0–100.0)
Monocytes Absolute: 0.5 10*3/uL (ref 0.1–1.0)
Monocytes Relative: 6.9 % (ref 3.0–12.0)
Neutro Abs: 4.4 10*3/uL (ref 1.4–7.7)
Neutrophils Relative %: 60.5 % (ref 43.0–77.0)
Platelets: 197 10*3/uL (ref 150.0–400.0)
RBC: 4.77 Mil/uL (ref 4.22–5.81)
RDW: 13.4 % (ref 11.5–15.5)
WBC: 7.3 10*3/uL (ref 4.0–10.5)

## 2020-02-11 LAB — COMPREHENSIVE METABOLIC PANEL
ALT: 8 U/L (ref 0–53)
AST: 11 U/L (ref 0–37)
Albumin: 4.1 g/dL (ref 3.5–5.2)
Alkaline Phosphatase: 61 U/L (ref 39–117)
BUN: 21 mg/dL (ref 6–23)
CO2: 27 mEq/L (ref 19–32)
Calcium: 9.4 mg/dL (ref 8.4–10.5)
Chloride: 103 mEq/L (ref 96–112)
Creatinine, Ser: 0.89 mg/dL (ref 0.40–1.50)
GFR: 86.37 mL/min (ref >60.00)
Glucose, Bld: 86 mg/dL (ref 70–99)
Potassium: 4.3 mEq/L (ref 3.5–5.1)
Sodium: 139 mEq/L (ref 135–145)
Total Bilirubin: 0.7 mg/dL (ref 0.2–1.2)
Total Protein: 6.7 g/dL (ref 6.0–8.3)

## 2020-02-11 MED ORDER — TADALAFIL 5 MG PO TABS: 5.0000 mg | ORAL_TABLET | Freq: Every day | ORAL | 3 refills | Status: DC

## 2020-02-11 NOTE — Assessment & Plan Note (Signed)
Patient has noted improvement in BPH symptoms on Cialis 5 mg daily.  He request change to 90-day supply despite the fact that this is $38 per prescription of 30 days-he may also try good Rx.  In addition for being for BPH-he is also noted improvement in erectile dysfunction-states would not need sildenafil anymore though opportunities have been lower with COVID-19

## 2020-02-11 NOTE — Assessment & Plan Note (Signed)
Doing very well recently.  Has not required Maxalt.  Offered refill but he declines for now

## 2020-02-11 NOTE — Progress Notes (Signed)
Phone [phone] In person visit   Subjective:   Christopher Leonard is a 64 y.o. year old very pleasant male patient who presents for/with See problem oriented charting Chief Complaint  Patient presents with  . Medical Clearance    Surgical     This visit occurred during the SARS-CoV-2 public health emergency.  Safety protocols were in place, including screening questions prior to the visit, additional usage of staff PPE, and extensive cleaning of exam room while observing appropriate contact time as indicated for disinfecting solutions.   Past Medical History-  Patient Active Problem List   Diagnosis Date Noted  . Chronic hepatitis C without hepatic coma ([place]) 09/06/2007    Priority: High  . BPH (benign prostatic hyperplasia) 02/11/2020    Priority: Medium  . Hyperlipidemia 06/20/2019    Priority: Medium  . Migraine headache without aura 09/10/2013    Priority: Medium  . Depression 10/08/2008    Priority: Medium  . Lipoma of face 03/15/2019    Priority: Low  . Onychomycosis 06/08/2018    Priority: Low  . History of adenomatous polyp of colon 12/28/2016    Priority: Low  . Erectile dysfunction 12/28/2016    Priority: Low  . HPV in male 03/12/2010    Priority: Low    Medications- reviewed and updated Current Outpatient Medications  Medication Sig Dispense Refill  . citalopram (CELEXA) 10 MG tablet TAKE ONE TABLET BY MOUTH ONE TIME DAILY  90 tablet 0  . ibuprofen (ADVIL,MOTRIN) 800 MG tablet Take 800 mg by mouth every 8 (eight) hours as needed.    . Multiple Vitamin (MULTIVITAMIN) tablet Take 1 tablet by mouth daily.    . Omega-3 1000 MG CAPS Take by mouth.    . rizatriptan (MAXALT-MLT) 5 MG disintegrating tablet TAKE 1 TABLET BY MOUTH ASNEEDED FOR MIGRAINE. MAY REPEAT IN 2HRS IF NEEDED 10 tablet 3  . tadalafil (CIALIS) 5 MG tablet Take 1 tablet (5 mg total) by mouth daily. For bph. Will trial flomax if not covered. 90 tablet 3   No current facility-administered  medications for this visit.     Objective:  BP 126/78   Pulse (!) 55   Temp 98.6 F (37 C) (Temporal)   Ht 5\' 10"  (1.778 m)   Wt 166 lb (75.3 kg)   SpO2 98%   BMI 23.82 kg/m  Gen: NAD, resting comfortably CV: RRR no murmurs rubs or gallops Lungs: CTAB no crackles, wheeze, rhonchi Abdomen: soft/nontender/nondistended/normal bowel sounds.  Ext: no edema Skin: warm, dry  EKG: Sinus bradycardia with rate 56, normal axis, normal intervals, no hypertrophy, no st or t wave changes    Assessment and Plan  #Procedure/surgical clearance  S:Patient is scheduled for Bio-restorative orthopedic autologous stem cell procedure on 02/27/2020 for his right shoulder.  Patient's work-up started with [facility].Mri supraspinatus complete tear. Partial infraspinatus. Tear long bicep tendon- question if retracted. Ultrasound in [place]- they thought not retracted (where he will be having a procedure). Posterior and anterior labral tear.  He spoke with orthopedics and they told him only option was total reverse shoulder replacement- end of the line Which may only last 10 years which was not appealing to him.  Patient researched options on his own and discovered [facility] in [place] with autologous stem cell transplants.  He actually will be put to sleep twice-1 to draw the stem cells off and then later to inject into the shoulder.  He will have [employer] for a week, then he will be part-time  for another week.  He will continue physical therapy but should be able to work after that-8-12 week improvement then plan 2nd time- 2nd time is supposed to be even more difficult.  Since they are trying to use the inflammation cascade to help heal the joint he will have to avoid NSAIDs/ice-we will only be able to use Tylenol for pain.  His long-term goal is 50% reduction in pain-they are hopeful he will have even more improvement than that.  A/P: 64 year old male with untreated hyperlipidemia and 10-year ASCVD  risk under 10%-he does not have to be on statin to proceed forward with the surgery.  From my perspective he is medically maximized for surgery/procedure. -In regards to presurgical clearance patient is easily able to tolerate 4 METS of activity without chest pain or shortness of breath-he does not need further presurgical evaluation. -EKG today was reassuring as well-was required by presurgical evaluation forms-otherwise I would not obtain this as he had a reassuring EKG in August -Since he has another procedure in 12 weeks-I would be fine doing another CBC/CMP again if they require it.  Would not require another office visit for surgical clearance from my perspective unless has a substantial change in his health.  Migraine headache without aura Doing very well recently.  Has not required Maxalt.  Offered refill but he declines for now  Hyperlipidemia Mild poor control without statin but 10-year risk for ASCVD risk calculation under 10%-we opted to continue efforts for healthy eating/regular exercise and recheck at next visit-patient would prefer to remain off statin if possible.  Perhaps we could use a 12% or 15% cut off and consider coronary CT beyond that-he did seem somewhat interested in coronary CT  Depression Patient doing well with Celexa 10 mg.  Remains in full remission.  He has tried off in the past and has irritability so we have opted to continue it  Erectile dysfunction/BPH (benign prostatic hyperplasia) Patient has noted improvement in BPH symptoms on Cialis 5 mg daily.  He request change to 90-day supply despite the fact that this is $38 per prescription of 30 days-he may also try good Rx.  In addition for being for BPH-he has also noted improvement in erectile dysfunction-states would not need sildenafil anymore though opportunities have been lower with COVID-19  Recommended follow up: Already scheduled for August follow-up Future Appointments  Date Time Provider [place]  06/25/2020  8:00 AM Marin Olp, MD [facility]    Lab/Order associations:   ICD-10-CM   1. Pre-operative clearance  Z01.818 EKG 12-Lead  2. Hyperlipidemia, unspecified hyperlipidemia type  E78.5 CBC with Differential/Platelet    Comprehensive metabolic panel  3. Migraine without aura and without status migrainosus, not intractable  G43.009   4. Major depressive disorder with single episode, in full remission ([place])  F32.5   5. Benign prostatic hyperplasia with urinary frequency  N40.1    R35.0   6. Erectile dysfunction due to arterial insufficiency  N52.01     Meds ordered this encounter  Medications  . tadalafil (CIALIS) 5 MG tablet    Sig: Take 1 tablet (5 mg total) by mouth daily. For bph. Will trial flomax if not covered.    Dispense:  90 tablet    Refill:  3    Time Spent: 34 minutes of total time (1:10 PM- 1:44 PM) was spent on the date of the encounter performing the following actions: chart review prior to seeing the patient, obtaining history, performing a medically necessary  exam, counseling on the treatment plan, placing orders, and documenting in our EHR.   Return precautions advised.  Garret Reddish, MD

## 2020-02-11 NOTE — Assessment & Plan Note (Signed)
Mild poor control without statin but 10-year risk for ASCVD risk calculation under 10%-we opted to continue efforts for healthy eating/regular exercise and recheck at next visit-patient would prefer to remain off statin if possible.  Perhaps we could use a 12% or 15% cut off and consider coronary CT beyond that-he did seem somewhat interested in coronary CT

## 2020-02-11 NOTE — Assessment & Plan Note (Signed)
Patient doing well with Celexa 10 mg.  Remains in full remission.  He has tried off in the past and has irritability so we have opted to continue it

## 2020-02-11 NOTE — Assessment & Plan Note (Signed)
Patient has noted improvement in BPH symptoms on Cialis 5 mg daily.  He request change to 90-day supply despite the fact that this is $38 per prescription of 30 days-he may also try good Rx.  In addition for being for BPH-he has also noted improvement in erectile dysfunction-states would not need sildenafil anymore though opportunities have been lower with COVID-19

## 2020-02-11 NOTE — Patient Instructions (Addendum)
Please stop by lab before you go If you do not have mychart- we will call you about results within 5 business days of [place] receiving them.  If you have mychart- we will send your results within 3 business days of [place] receiving them.  If abnormal or we want to clarify a result, we will call or mychart you to make sure you receive the message.  If you have questions or concerns or don't hear within 5 business days, please send [place] a message or call us.   Best of luck with the procedure x2   We will try to send this by tomorrow at the latest   Recommended follow up: see you back in august- hopefully you are in much better shape by then with the shoulder!

## 2020-03-06 ENCOUNTER — Telehealth: Payer: Self-pay | Admitting: Family Medicine

## 2020-03-06 NOTE — Telephone Encounter (Signed)
Patient spoke with [facility] on 03/06/20 at 3:55pm.  States that he had sudden hearing loss this morning in the right ear.  States he was feeling of fullness in the ear also.  Patient has tinnitus.  Patient was advised to see PCP within 4 hours by Devoria Albe RN. I spoke with patient.  Patient stated he was going to go to [facility] but states that his hearing has came back. I offered to schedule patient to be seen tomorrow.   Patient declined.  Stating that he works in a [employer] and will get someone to look in his ear tomorrow.  States if it is wax, he will do an at home treatment.

## 2020-03-06 NOTE — Telephone Encounter (Signed)
FYI

## 2020-03-07 NOTE — Telephone Encounter (Signed)
Thanks for the update

## 2020-05-14 ENCOUNTER — Other Ambulatory Visit: Payer: Self-pay | Admitting: Family Medicine

## 2020-06-23 NOTE — Progress Notes (Signed)
Phone: [phone]   Subjective:  Patient presents today for their annual physical. Chief complaint-noted.   See problem oriented charting- Review of Systems  Constitutional: Negative for chills and fever.  HENT: Negative for congestion and hearing loss.   Eyes: Negative for blurred vision and double vision.  Respiratory: Negative for cough and shortness of breath.   Cardiovascular: Negative for chest pain and palpitations.  Gastrointestinal: Negative for constipation, diarrhea, nausea and vomiting.  Genitourinary: Positive for frequency (nocturia once a night). Negative for dysuria.  Musculoskeletal: Positive for joint pain. Negative for falls.  Skin: Negative for itching and rash.  Neurological: Negative for dizziness and headaches.  Endo/Heme/Allergies: Negative for polydipsia. Does not bruise/bleed easily.  Psychiatric/Behavioral: Negative for depression, substance abuse and suicidal ideas.   The following were reviewed and entered/updated in epic: Past Medical History:  Diagnosis Date  . Anxiety   . Depression   . Hepatitis C   . Persistent headaches   . Shoulder pain, right    torn rotator cuff- putting off  . Substance abuse [facility])    Patient Active Problem List   Diagnosis Date Noted  . Chronic hepatitis C without hepatic coma ([place]) 09/06/2007    Priority: High  . BPH (benign prostatic hyperplasia) 02/11/2020    Priority: Medium  . Hyperlipidemia 06/20/2019    Priority: Medium  . Migraine headache without aura 09/10/2013    Priority: Medium  . Depression 10/08/2008    Priority: Medium  . Lipoma of face 03/15/2019    Priority: Low  . Onychomycosis 06/08/2018    Priority: Low  . History of adenomatous polyp of colon 12/28/2016    Priority: Low  . Erectile dysfunction 12/28/2016    Priority: Low  . HPV in male 03/12/2010    Priority: Low   Past Surgical History:  Procedure Laterality Date  . COLONOSCOPY    . INGUINAL HERNIA REPAIR     right  .  POLYPECTOMY    . WISDOM TOOTH EXTRACTION      Family History  Problem Relation Age of Onset  . Renal cancer Mother        right nephrectomy. mets to lung- oral meds for that  . Non-Hodgkin's lymphoma Mother        chemo  . Hypertension Mother   . Other Mother        TIA  . Parkinson's disease Father        died age 37. complications from parkinsons. 20 years.   . Arthritis Sister        hip replacement  . Rheumatic fever Brother        cataracts at 87, not sure about heart issues  . Colon cancer Maternal Grandfather   . Heart attack Maternal Grandfather        age 51 reported 02/11/20 visit  . Pancreatic cancer Sister        doing well post surgery  . Esophageal cancer Neg Hx   . Liver cancer Neg Hx   . Rectal cancer Neg Hx   . Stomach cancer Neg Hx     Medications- reviewed and updated Current Outpatient Medications  Medication Sig Dispense Refill  . citalopram (CELEXA) 10 MG tablet TAKE ONE TABLET BY MOUTH ONE TIME DAILY 90 tablet 0  . Multiple Vitamin (MULTIVITAMIN) tablet Take 1 tablet by mouth daily.    . Omega-3 1000 MG CAPS Take by mouth.    . tadalafil (CIALIS) 5 MG tablet Take 1 tablet (5 mg total) by  mouth daily. For bph. Will trial flomax if not covered. 90 tablet 3   No current facility-administered medications for this visit.    Allergies-reviewed and updated Allergies  Allergen Reactions  . Poison Ivy Extract     Social History   Social History Narrative   GF. 1 daughter age 35- in town. No grandkids.       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       Hobbies: rest, white water kayaker in past- shoulder prevents   Objective  Objective:  BP 122/60   Pulse 62   Temp 98.6 F (37 C) (Temporal)   Ht 5\' 10"  (1.778 m)   Wt 166 lb (75.3 kg)   SpO2 97%   BMI 23.82 kg/m  Gen: NAD, resting comfortably HEENT: Mucous membranes are moist. Oropharynx normal other than small polyp on  left side of tongue (follows with dentist and no change) . TM normal on the right. On the left there is a polyp that takes up 50-75% of ear canal - but rest of TM normal. Declines ENT exam- if worsening agrees to referral Neck: no thyromegaly CV: RRR no murmurs rubs or gallops Lungs: CTAB no crackles, wheeze, rhonchi Abdomen: soft/nontender/nondistended/normal bowel sounds. No rebound or guarding.  Ext: no edema Skin: warm, dry Neuro: grossly normal, moves all extremities, PERRLA    Assessment and Plan  64 y.o. male presenting for annual physical.  Health Maintenance counseling: 1. Anticipatory guidance: Patient counseled regarding regular dental exams -q6 months, eye exams -yearly with Dr. Gershon Crane,  avoiding smoking and second hand smoke , limiting alcohol to 0 beverages per day - recovered alcoholic- no drinks in 37 years.   2. Risk factor reduction:  Advised patient of need for regular exercise and diet rich and fruits and vegetables to reduce risk of heart attack and stroke. Exercise- back in gym for lower body primarily. Diet- reasonably healthy- GF cooks pretty healthy.  Wt Readings from Last 3 Encounters:  06/25/20 166 lb (75.3 kg)  02/11/20 166 lb (75.3 kg)  06/20/19 171 lb (77.6 kg)  3. Immunizations/screenings/ancillary studies- recommended fall flu shot. Thanked him for doing covid 19 vaccine . Discussed shingrix (he is not sure if he had chicken pox)- hold off for now Immunization History  Administered Date(s) Administered  . Hepatitis A, Adult 04/13/2017, 09/26/2017  . Hepatitis B, adult 04/13/2017, 05/23/2017, 09/26/2017  . Influenza Whole 08/15/2009, 08/16/2019  . Influenza-Unspecified 08/18/2016  . Moderna SARS-COVID-2 Vaccination 11/20/2019, 12/17/2019  . Pneumococcal Polysaccharide-23 03/09/2017  . Td 11/15/2001  . Tdap 05/30/2012   4. Prostate cancer screening-   Some BPH on prior exams- trend PSA. 1-2x a night nocturia. Had tried cialis to see if covered for daily  use - feels has helped quite a bit. Down to about once a night nocuria Lab Results  Component Value Date   PSA 2.72 06/20/2019   PSA 2.65 06/08/2018   PSA 1.99 05/28/2015   5. Colon cancer screening -  January 17 2018 with 3 year repeat due to adenomas 6. Skin cancer screening- saw dermatology 2 years ago. advised regular sunscreen use. Denies worrisome, changing, or new skin lesions.  7. former smoker- 38 years cig free. At 51 get AAA 8. STD screening - ops in- new GF and only with her but wants to play things safe. Possible warts years ago but no new lesions.   Status of chronic or acute concerns   #social  update- new EMR and working through that  # Depression S: Medication:Celexa 10Mg  Depression screen [facility] 2/9 06/25/2020 02/11/2020 06/20/2019  Decreased Interest 0 0 0  Down, Depressed, Hopeless 0 0 0  PHQ - 2 Score 0 0 0  Altered sleeping 0 0 1  Tired, decreased energy 0 0 0  Change in appetite 0 0 0  Feeling bad or failure about yourself  0 0 0  Trouble concentrating 0 0 0  Moving slowly or fidgety/restless 0 0 0  Suicidal thoughts 0 0 0  PHQ-9 Score 0 0 1  Difficult doing work/chores Not difficult at all Not difficult at all Not difficult at all  A/P:  appears to be in full remission- continue current meds  #hyperlipidemia S: Medication: flaxseed oil. Paternal grandfather heart attack at 76.  Lab Results  Component Value Date   CHOL 181 06/20/2019   HDL 44.10 06/20/2019   LDLCALC 123 (H) 06/20/2019   LDLDIRECT 141.1 10/01/2008   TRIG 69.0 06/20/2019   CHOLHDL 4 06/20/2019   A/P:  update lipids and 10 year ascvd risk -he has read fenofibrate reduces covid risk by 70%- hed be interested.  - interested in coronary calcium scoring  # chronic hepatitis C- seen by ID in 2018 and considered cured after treatment. Check LFTs and Hep C RNA  #Migraine-takes Maxalt as needed in past.  Thankfully the last few years have been better- off for 3 years  #Erectile dysfunction-has been  on sildenafil but we opted to try Cialis 5 mg daily and helpful for BPH and ED  #Palpitations-EKG reassuring last year but he declined cardiac monitoring.  He thought may be anxiety related as today he reports they have not returned   #BPH-see above  #Right shoulder pain related to supraspinatus complete tear and partial infraspinatus tear as well as tear of the long bicep tendon-ultimately patient landed with a [facility] in [place]- Bio-restorative orthopedic autologous stem cell procedure on 02/27/2020 for his right shoulder and 2nd procedure/surgery at later date. Intensive process- with 2nd one only had to do sling for 3 ays. Doing PT and looking forward to weights. Real test splitting firewood in september  Recommended follow up: 1 year CPE  Lab/Order associations: not fasting   ICD-10-CM   1. Preventative health care  Z00.00 CBC with Differential/Platelet    Comprehensive metabolic panel    Lipid panel    PSA  2. Major depressive disorder with single episode, in full remission ([place])  F32.5   3. Hyperlipidemia, unspecified hyperlipidemia type  E78.5 Lipid panel  4. Screening for prostate cancer  Z12.5 PSA  5. Erectile dysfunction due to arterial insufficiency  N52.01   6. Encounter for hepatitis C screening test for low risk patient  Z11.59     No orders of the defined types were placed in this encounter.   Return precautions advised.  Garret Reddish, MD

## 2020-06-23 NOTE — Patient Instructions (Addendum)
Health Maintenance Due  Topic Date Due  . INFLUENZA VACCINE will get later in the year at work. Please let our office know when you get it.  06/15/2020   Shoulder: So glad you have having improvement with stem cell therapy. We will test your testosterone level as requested today.   Cholesterol: has been mildly elevated in the past we will recheck today. We will contact you if any medications need to be discussed. If your levels are the same or higher we may look at ordering a CT.   Migraine: if you start having increased issues let our office know. We have taken off your list but if you have reoccurrence let our office know.   Continue working on diet and exercise.   If you decide to get Singles vaccination let our office know we can do as a nurse visit at any time.   If you have any questions or concerns before your next scheduled appointment please give our office a call.   We will keep an eye on your ear. If you start having any issues let our office know and we will send you to ENT.   We have scheduled your Physical for next year. 06/25/21 at 8:20.

## 2020-06-25 ENCOUNTER — Other Ambulatory Visit: Payer: Self-pay

## 2020-06-25 ENCOUNTER — Ambulatory Visit (INDEPENDENT_AMBULATORY_CARE_PROVIDER_SITE_OTHER): Payer: BC Managed Care – PPO | Admitting: Family Medicine

## 2020-06-25 ENCOUNTER — Encounter: Payer: Self-pay | Admitting: Family Medicine

## 2020-06-25 VITALS — BP 122/60 | HR 62 | Temp 98.6°F | Ht 70.0 in | Wt 166.0 lb

## 2020-06-25 DIAGNOSIS — Z1159 Encounter for screening for other viral diseases: Secondary | ICD-10-CM

## 2020-06-25 DIAGNOSIS — F1021 Alcohol dependence, in remission: Secondary | ICD-10-CM

## 2020-06-25 DIAGNOSIS — Z125 Encounter for screening for malignant neoplasm of prostate: Secondary | ICD-10-CM

## 2020-06-25 DIAGNOSIS — N5201 Erectile dysfunction due to arterial insufficiency: Secondary | ICD-10-CM

## 2020-06-25 DIAGNOSIS — F325 Major depressive disorder, single episode, in full remission: Secondary | ICD-10-CM | POA: Diagnosis not present

## 2020-06-25 DIAGNOSIS — Z0000 Encounter for general adult medical examination without abnormal findings: Secondary | ICD-10-CM | POA: Diagnosis not present

## 2020-06-25 DIAGNOSIS — E785 Hyperlipidemia, unspecified: Secondary | ICD-10-CM

## 2020-06-25 DIAGNOSIS — Z113 Encounter for screening for infections with a predominantly sexual mode of transmission: Secondary | ICD-10-CM

## 2020-06-25 DIAGNOSIS — Z114 Encounter for screening for human immunodeficiency virus [HIV]: Secondary | ICD-10-CM

## 2020-06-25 MED ORDER — CITALOPRAM HYDROBROMIDE 10 MG PO TABS: 10.0000 mg | ORAL_TABLET | Freq: Every day | ORAL | 3 refills | Status: DC

## 2020-06-25 NOTE — Addendum Note (Signed)
Addended by: Liliane Channel on: 06/25/2020 09:49 AM   Modules accepted: Orders

## 2020-06-26 ENCOUNTER — Other Ambulatory Visit (HOSPITAL_COMMUNITY)
Admission: RE | Admit: 2020-06-26 | Discharge: 2020-06-26 | Disposition: A | Discharge status: Home / Self Care | Payer: BC Managed Care – PPO | Source: Ambulatory Visit | Attending: Family Medicine | Admitting: Family Medicine

## 2020-06-26 ENCOUNTER — Other Ambulatory Visit: Payer: BC Managed Care – PPO

## 2020-06-26 DIAGNOSIS — Z114 Encounter for screening for human immunodeficiency virus [HIV]: Secondary | ICD-10-CM

## 2020-06-26 DIAGNOSIS — N529 Male erectile dysfunction, unspecified: Secondary | ICD-10-CM | POA: Diagnosis not present

## 2020-06-26 DIAGNOSIS — Z113 Encounter for screening for infections with a predominantly sexual mode of transmission: Secondary | ICD-10-CM

## 2020-06-26 DIAGNOSIS — E785 Hyperlipidemia, unspecified: Secondary | ICD-10-CM

## 2020-06-26 DIAGNOSIS — Z0000 Encounter for general adult medical examination without abnormal findings: Secondary | ICD-10-CM | POA: Diagnosis not present

## 2020-06-26 DIAGNOSIS — N5201 Erectile dysfunction due to arterial insufficiency: Secondary | ICD-10-CM

## 2020-06-26 DIAGNOSIS — Z1159 Encounter for screening for other viral diseases: Secondary | ICD-10-CM

## 2020-06-26 DIAGNOSIS — Z125 Encounter for screening for malignant neoplasm of prostate: Secondary | ICD-10-CM | POA: Diagnosis not present

## 2020-06-26 LAB — LIPID PANEL
Cholesterol: 189 mg/dL (ref <200)
HDL: 47 mg/dL (ref >=40)
LDL Cholesterol (Calc): 124 mg/dL — ABNORMAL HIGH
Non-HDL Cholesterol (Calc): 142 mg/dL — ABNORMAL HIGH (ref <130)
Total CHOL/HDL Ratio: 4 (calc) (ref <5.0)
Triglycerides: 79 mg/dL (ref <150)

## 2020-06-26 LAB — CBC WITH DIFFERENTIAL/PLATELET
Absolute Monocytes: 443 cells/uL (ref 200–950)
Basophils Absolute: 30 cells/uL (ref 0–200)
Basophils Relative: 0.5 %
Eosinophils Absolute: 118 cells/uL (ref 15–500)
Eosinophils Relative: 2 %
HCT: 48.8 % (ref 38.5–50.0)
Hemoglobin: 16.3 g/dL (ref 13.2–17.1)
Lymphs Abs: 1805 cells/uL (ref 850–3900)
MCH: 31.3 pg (ref 27.0–33.0)
MCHC: 33.4 g/dL (ref 32.0–36.0)
MCV: 93.7 fL (ref 80.0–100.0)
MPV: 10.5 fL (ref 7.5–12.5)
Monocytes Relative: 7.5 %
Neutro Abs: 3505 cells/uL (ref 1500–7800)
Neutrophils Relative %: 59.4 %
Platelets: 250 10*3/uL (ref 140–400)
RBC: 5.21 10*6/uL (ref 4.20–5.80)
RDW: 12.1 % (ref 11.0–15.0)
Total Lymphocyte: 30.6 %
WBC: 5.9 10*3/uL (ref 3.8–10.8)

## 2020-06-26 LAB — COMPREHENSIVE METABOLIC PANEL WITH GFR
AG Ratio: 1.6 (calc) (ref 1.0–2.5)
ALT: 9 U/L (ref 9–46)
AST: 15 U/L (ref 10–35)
Albumin: 4.4 g/dL (ref 3.6–5.1)
Alkaline phosphatase (APISO): 66 U/L (ref 35–144)
BUN: 20 mg/dL (ref 7–25)
CO2: 26 mmol/L (ref 20–32)
Calcium: 10.1 mg/dL (ref 8.6–10.3)
Chloride: 105 mmol/L (ref 98–110)
Creat: 1.09 mg/dL (ref 0.70–1.25)
Globulin: 2.8 g/dL (ref 1.9–3.7)
Glucose, Bld: 91 mg/dL (ref 65–99)
Potassium: 4.5 mmol/L (ref 3.5–5.3)
Sodium: 139 mmol/L (ref 135–146)
Total Bilirubin: 0.7 mg/dL (ref 0.2–1.2)
Total Protein: 7.2 g/dL (ref 6.1–8.1)

## 2020-06-26 LAB — PSA: PSA: 3.2 ng/mL (ref <=4.0)

## 2020-06-27 LAB — URINE CYTOLOGY ANCILLARY ONLY
Chlamydia: NEGATIVE
Comment: NEGATIVE
Comment: NEGATIVE
Comment: NORMAL
Neisseria Gonorrhea: NEGATIVE
Trichomonas: NEGATIVE

## 2020-06-29 LAB — HEPATITIS C RNA QUANTITATIVE
HCV RNA, PCR, QN (Log): <1.18 log IU/mL
HCV RNA, PCR, QN: <15 IU/mL

## 2020-06-30 LAB — HIV ANTIBODY (ROUTINE TESTING W REFLEX): HIV 1&2 Ab, 4th Generation: NONREACTIVE

## 2020-06-30 LAB — RPR: RPR Ser Ql: NONREACTIVE

## 2020-06-30 LAB — TESTOS,TOTAL,FREE AND SHBG (FEMALE)
Free Testosterone: 65.8 pg/mL (ref 35.0–155.0)
Sex Hormone Binding: 59 nmol/L (ref 22–77)
Testosterone, Total, LC-MS-MS: 609 ng/dL (ref 250–1100)

## 2020-07-09 DIAGNOSIS — Z20822 Contact with and (suspected) exposure to covid-19: Secondary | ICD-10-CM | POA: Diagnosis not present

## 2020-08-05 DIAGNOSIS — Z20822 Contact with and (suspected) exposure to covid-19: Secondary | ICD-10-CM | POA: Diagnosis not present

## 2020-12-01 ENCOUNTER — Encounter: Payer: Self-pay | Admitting: Family Medicine

## 2020-12-08 ENCOUNTER — Other Ambulatory Visit: Payer: Self-pay

## 2020-12-08 ENCOUNTER — Encounter: Payer: Self-pay | Admitting: Family Medicine

## 2020-12-08 ENCOUNTER — Ambulatory Visit (INDEPENDENT_AMBULATORY_CARE_PROVIDER_SITE_OTHER): Payer: BC Managed Care – PPO | Admitting: Family Medicine

## 2020-12-08 VITALS — BP 120/78 | HR 56 | Temp 98.3°F | Ht 70.0 in | Wt 161.2 lb

## 2020-12-08 DIAGNOSIS — R972 Elevated prostate specific antigen [PSA]: Secondary | ICD-10-CM | POA: Diagnosis not present

## 2020-12-08 DIAGNOSIS — R350 Frequency of micturition: Secondary | ICD-10-CM

## 2020-12-08 DIAGNOSIS — N401 Enlarged prostate with lower urinary tract symptoms: Secondary | ICD-10-CM

## 2020-12-08 MED ORDER — TADALAFIL 5 MG PO TABS: 5.0000 mg | ORAL_TABLET | Freq: Every day | ORAL | 3 refills | Status: DC

## 2020-12-08 NOTE — Patient Instructions (Addendum)
Please stop by lab before you go If you have mychart- we will send your results within 3 business days of [place] receiving them.  If you do not have mychart- we will call you about results within 5 business days of [place] receiving them.  *please also note that you will see labs on mychart as soon as they post. I will later go in and write notes on them- will say "notes from Dr. Yong Channel"  If you change your mind and want to see urology I can put in a referral (and honestly you can likely self refer if needed)  Recommended follow up: keep august visit

## 2020-12-08 NOTE — Progress Notes (Signed)
Phone [phone] In person visit   Subjective:   Christopher Leonard is a 65 y.o. year old very pleasant male patient who presents for/with See problem oriented charting Chief Complaint  Patient presents with  . Medication Management    Cialis   . Lab work    Patient wants to finish discussing his PSA with you and would like to have that checked today     This visit occurred during the SARS-CoV-2 public health emergency.  Safety protocols were in place, including screening questions prior to the visit, additional usage of staff PPE, and extensive cleaning of exam room while observing appropriate contact time as indicated for disinfecting solutions.   Past Medical History-  Patient Active Problem List   Diagnosis Date Noted  . Alcohol dependence in remission ([place]) 06/25/2020    Priority: High  . Chronic hepatitis C without hepatic coma ([place]) 09/06/2007    Priority: High  . BPH (benign prostatic hyperplasia) 02/11/2020    Priority: Medium  . Hyperlipidemia 06/20/2019    Priority: Medium  . Migraine headache without aura 09/10/2013    Priority: Medium  . Depression 10/08/2008    Priority: Medium  . Lipoma of face 03/15/2019    Priority: Low  . Onychomycosis 06/08/2018    Priority: Low  . History of adenomatous polyp of colon 12/28/2016    Priority: Low  . Erectile dysfunction 12/28/2016    Priority: Low  . HPV in male 03/12/2010    Priority: Low  . Pain in joint of right shoulder 11/07/2019    Medications- reviewed and updated Current Outpatient Medications  Medication Sig Dispense Refill  . citalopram (CELEXA) 10 MG tablet Take 1 tablet (10 mg total) by mouth daily. 90 tablet 3  . Multiple Vitamin (MULTIVITAMIN) tablet Take 1 tablet by mouth daily.    . Omega-3 1000 MG CAPS Take by mouth.    . tadalafil (CIALIS) 5 MG tablet Take 1 tablet (5 mg total) by mouth daily. For bph. 100 tablet 3   No current facility-administered medications for this visit.     Objective:   BP 120/78   Pulse (!) 56   Temp 98.3 F (36.8 C) (Temporal)   Ht 5\' 10"  (1.778 m)   Wt 161 lb 3.2 oz (73.1 kg)   SpO2 98%   BMI 23.13 kg/m  Gen: NAD, resting comfortably     Assessment and Plan   # Elevated PSA with BPH on prior exams S: nocturia 1-2x a night. Cialis daily 5 mg helpful in the past has not been as helpful lately. He has noted more frequency, more retention/incomplete voiding, occasional dribbling. No sex within last 48 hours. Gym yesterday- 20 mins cardio, some legs, some chest- didn't overdo it.   No major ED issues- able to get 2nd erection if needed (his main concern off meds)- had sinus pressure on viagra Lab Results  Component Value Date   PSA 3.2 06/26/2020   PSA 2.72 06/20/2019   PSA 2.65 06/08/2018  A/P: patient with increase of PSA of nearly 0.5 last visit-we opted to go ahead and recheck today especially with some progression in his symptoms for BPH -Discussed trialing Flomax instead of Cialis (patient really has minimal ED issues) but he would like hold off for now -Discussed adding finasteride to Cialis.  Concerned gynecomastia -Offered urology visit as well -Any and he prefers to remain on Cialis 5 mg daily.  He wanted to try 10 mg daily but we discussed I have not  previously prescribed at a higher level and I do not see it indicated-would likely need to get urology's opinion for something like this  Recommended follow up: Keep office visit Future Appointments  Date Time Provider [place]  06/25/2021  8:20 AM Marin Olp, MD [facility]   Lab/Order associations:   ICD-10-CM   1. Elevated PSA  R97.20 PSA  2. Benign prostatic hyperplasia with urinary frequency (chronic condition but worsening with medication management) N40.1 PSA   R35.0     Meds ordered this encounter  Medications  . tadalafil (CIALIS) 5 MG tablet    Sig: Take 1 tablet (5 mg total) by mouth daily. For bph.    Dispense:  100 tablet    Refill:  3   Return  precautions advised.  Garret Reddish, MD

## 2020-12-09 LAB — PSA: PSA: 2.48 ng/mL (ref 0.10–4.00)

## 2020-12-17 DIAGNOSIS — F4310 Post-traumatic stress disorder, unspecified: Secondary | ICD-10-CM | POA: Diagnosis not present

## 2020-12-25 DIAGNOSIS — F4310 Post-traumatic stress disorder, unspecified: Secondary | ICD-10-CM | POA: Diagnosis not present

## 2021-01-01 DIAGNOSIS — F4310 Post-traumatic stress disorder, unspecified: Secondary | ICD-10-CM | POA: Diagnosis not present

## 2021-01-07 DIAGNOSIS — F4310 Post-traumatic stress disorder, unspecified: Secondary | ICD-10-CM | POA: Diagnosis not present

## 2021-01-09 ENCOUNTER — Ambulatory Visit: Payer: BC Managed Care – PPO | Admitting: Family Medicine

## 2021-01-14 DIAGNOSIS — F4310 Post-traumatic stress disorder, unspecified: Secondary | ICD-10-CM | POA: Diagnosis not present

## 2021-01-21 DIAGNOSIS — F4310 Post-traumatic stress disorder, unspecified: Secondary | ICD-10-CM | POA: Diagnosis not present

## 2021-01-26 ENCOUNTER — Ambulatory Visit (AMBULATORY_SURGERY_CENTER): Payer: BC Managed Care – PPO | Admitting: *Deleted

## 2021-01-26 ENCOUNTER — Other Ambulatory Visit: Payer: Self-pay

## 2021-01-26 VITALS — Ht 70.0 in | Wt 167.0 lb

## 2021-01-26 DIAGNOSIS — Z86010 Personal history of colonic polyps: Secondary | ICD-10-CM

## 2021-01-26 MED ORDER — PEG 3350-KCL-NA BICARB-NACL 420 G PO SOLR: 4000.0000 mL | Freq: Once | ORAL | 0 refills | Status: AC

## 2021-01-26 NOTE — Progress Notes (Signed)
No egg or soy allergy known to patient  No issues with past sedation with any surgeries or procedures No intubation issues that have been told to pt in the past  No FH of Malignant Hyperthermia No diet pills per patient No home 02 use per patient  No blood thinners per patient  Pt denies issues with constipation  No A fib or A flutter  EMMI video to pt or via [place] 19 guidelines implemented in PV today with Pt and RN  Pt is fully vaccinated  for Covid     Due to the COVID-19 pandemic we are asking patients to follow certain guidelines.  Pt aware of COVID protocols and [facility] guidelines

## 2021-01-27 ENCOUNTER — Encounter: Payer: Self-pay | Admitting: Gastroenterology

## 2021-01-29 DIAGNOSIS — F4310 Post-traumatic stress disorder, unspecified: Secondary | ICD-10-CM | POA: Diagnosis not present

## 2021-02-09 ENCOUNTER — Other Ambulatory Visit: Payer: Self-pay

## 2021-02-09 ENCOUNTER — Encounter: Payer: Self-pay | Admitting: Gastroenterology

## 2021-02-09 ENCOUNTER — Ambulatory Visit (AMBULATORY_SURGERY_CENTER): Payer: BC Managed Care – PPO | Admitting: Gastroenterology

## 2021-02-09 VITALS — BP 121/79 | HR 58 | Temp 97.1°F | Resp 10 | Ht 70.0 in | Wt 167.0 lb

## 2021-02-09 DIAGNOSIS — D124 Benign neoplasm of descending colon: Secondary | ICD-10-CM

## 2021-02-09 DIAGNOSIS — Z860100 Personal history of colon polyps, unspecified: Secondary | ICD-10-CM

## 2021-02-09 DIAGNOSIS — D123 Benign neoplasm of transverse colon: Secondary | ICD-10-CM | POA: Diagnosis not present

## 2021-02-09 DIAGNOSIS — Z1211 Encounter for screening for malignant neoplasm of colon: Secondary | ICD-10-CM | POA: Diagnosis not present

## 2021-02-09 DIAGNOSIS — D125 Benign neoplasm of sigmoid colon: Secondary | ICD-10-CM | POA: Diagnosis not present

## 2021-02-09 MED ORDER — SODIUM CHLORIDE 0.9 % IV SOLN: 500.0000 mL | Freq: Once | INTRAVENOUS | Status: DC

## 2021-02-09 NOTE — Op Note (Signed)
[place] Patient Name: Christopher Leonard Procedure Date: 02/09/2021 10:08 AM MRN: [id number] Endoscopist: Milus Banister , MD Age: 65 Referring MD:  Date of Birth: 12-06-1955 Gender: Male Account #: [id number] Procedure:                Colonoscopy Indications:              High risk colon cancer surveillance: Personal                            history of colonic polyps; 2008 colonoscopy one                            subCM adenoma, colonoscopy 2013 one subCM SSA;                            colonsocopy 2019 five polyps, mixed SSPs and TAs                            including one 58mm TA Medicines:                Monitored Anesthesia Care Procedure:                Pre-Anesthesia Assessment:                           - Prior to the procedure, a History and Physical                            was performed, and patient medications and                            allergies were reviewed. The patient's tolerance of                            previous anesthesia was also reviewed. The risks                            and benefits of the procedure and the sedation                            options and risks were discussed with the patient.                            All questions were answered, and informed consent                            was obtained. Prior Anticoagulants: The patient has                            taken no previous anticoagulant or antiplatelet                            agents. ASA Grade Assessment: II - A patient with  mild systemic disease. After reviewing the risks                            and benefits, the patient was deemed in                            satisfactory condition to undergo the procedure.                           After obtaining informed consent, the colonoscope                            was passed under direct vision. Throughout the                            procedure, the patient's blood pressure, pulse, and                             oxygen saturations were monitored continuously. The                            Olympus CF-HQ190L ([id number]) Colonoscope was                            introduced through the anus and advanced to the the                            cecum, identified by appendiceal orifice and                            ileocecal valve. The colonoscopy was performed                            without difficulty. The patient tolerated the                            procedure well. Scope In: 10:16:19 AM Scope Out: 10:33:45 AM Scope Withdrawal Time: 0 hours 12 minutes 17 seconds  Total Procedure Duration: 0 hours 17 minutes 26 seconds  Findings:                 Three sessile polyps were found in the sigmoid                            colon, descending colon and transverse colon. The                            polyps were 2 to 4 mm in size. These polyps were                            removed with a cold snare. Resection and retrieval                            were complete.  Internal hemorrhoids were found. The hemorrhoids                            were small.                           The exam was otherwise without abnormality on                            direct and retroflexion views. Complications:            No immediate complications. Estimated blood loss:                            None. Estimated Blood Loss:     Estimated blood loss: none. Impression:               - Three 2 to 4 mm polyps in the sigmoid colon, in                            the descending colon and in the transverse colon,                            removed with a cold snare. Resected and retrieved.                           - Internal hemorrhoids.                           - The examination was otherwise normal on direct                            and retroflexion views. Recommendation:           - Patient has a contact number available for                            emergencies. The  signs and symptoms of potential                            delayed complications were discussed with the                            patient. Return to normal activities tomorrow.                            Written discharge instructions were provided to the                            patient.                           - Resume previous diet.                           - Continue present medications.                           -  Await pathology results. Milus Banister, MD 02/09/2021 10:37:28 AM This report has been signed electronically.

## 2021-02-09 NOTE — Progress Notes (Signed)
Pt. Reports no change in his medical or surgical history since his pre-visit 01/26/2021. 

## 2021-02-09 NOTE — Patient Instructions (Addendum)
Handouts were given to your care partner on polyps and hemorrhoids. You may resume your current medications today. Await biopsy results.  May take 1-3 weeks to receive pathology results. Please call if any questions or concerns.      YOU HAD AN ENDOSCOPIC PROCEDURE TODAY AT [facility]:   Refer to the procedure report that was given to you for any specific questions about what was found during the examination.  If the procedure report does not answer your questions, please call your gastroenterologist to clarify.  If you requested that your care partner not be given the details of your procedure findings, then the procedure report has been included in a sealed envelope for you to review at your convenience later.  YOU SHOULD EXPECT: Some feelings of bloating in the abdomen. Passage of more gas than usual.  Walking can help get rid of the air that was put into your GI tract during the procedure and reduce the bloating. If you had a lower endoscopy (such as a colonoscopy or flexible sigmoidoscopy) you may notice spotting of blood in your stool or on the toilet paper. If you underwent a bowel prep for your procedure, you may not have a normal bowel movement for a few days.  Please Note:  You might notice some irritation and congestion in your nose or some drainage.  This is from the oxygen used during your procedure.  There is no need for concern and it should clear up in a day or so.  SYMPTOMS TO REPORT IMMEDIATELY:   Following lower endoscopy (colonoscopy or flexible sigmoidoscopy):  Excessive amounts of blood in the stool  Significant tenderness or worsening of abdominal pains  Swelling of the abdomen that is new, acute  Fever of 100F or higher   For urgent or emergent issues, a gastroenterologist can be reached at any hour by calling [phone]. Do not use MyChart messaging for urgent concerns.    DIET:  We do recommend a small meal at first, but then you may  proceed to your regular diet.  Drink plenty of fluids but you should avoid alcoholic beverages for 24 hours.  ACTIVITY:  You should plan to take it easy for the rest of today and you should NOT DRIVE or use heavy machinery until tomorrow (because of the sedation medicines used during the test).    FOLLOW UP: Our staff will call the number listed on your records 48-72 hours following your procedure to check on you and address any questions or concerns that you may have regarding the information given to you following your procedure. If we do not reach you, we will leave a message.  We will attempt to reach you two times.  During this call, we will ask if you have developed any symptoms of COVID 19. If you develop any symptoms (ie: fever, flu-like symptoms, shortness of breath, cough etc.) before then, please call [phone].  If you test positive for Covid 19 in the 2 weeks post procedure, please call and report this information to [place].    If any biopsies were taken you will be contacted by phone or by letter within the next 1-3 weeks.  Please call us at [phone] if you have not heard about the biopsies in 3 weeks.    SIGNATURES/CONFIDENTIALITY: You and/or your care partner have signed paperwork which will be entered into your electronic medical record.  These signatures attest to the fact that that the information above on your After  Visit Summary has been reviewed and is understood.  Full responsibility of the confidentiality of this discharge information lies with you and/or your care-partner. 

## 2021-02-09 NOTE — Progress Notes (Addendum)
Pt was asleep when Dr. Ardis Hughs came in to speak with him re: finding ob colonoscopy report.  He let pt continue to sleep and will come back after the next case and speak with him and his Step mother.  No problems noted in the recovery room. maw

## 2021-02-09 NOTE — Progress Notes (Signed)
Report given to PACU, vss 

## 2021-02-09 NOTE — Progress Notes (Signed)
Called to room to assist during endoscopic procedure.  Patient ID and intended procedure confirmed with present staff. Received instructions for my participation in the procedure from the performing physician.  

## 2021-02-11 ENCOUNTER — Telehealth: Payer: Self-pay

## 2021-02-11 NOTE — Telephone Encounter (Signed)
  Follow up Call-  Call back number 02/09/2021  Post procedure Call Back phone  # [phone]  Permission to leave phone message Yes  Some recent data might be hidden     Patient questions:  Do you have a fever, pain , or abdominal swelling? No. Pain Score  0 *  Have you tolerated food without any problems? Yes.    Have you been able to return to your normal activities? Yes.    Do you have any questions about your discharge instructions: Diet   No. Medications  No. Follow up visit  No.  Do you have questions or concerns about your Care? Yes  Pt states he has some "cramping" feeling in his rectum that started this morning, he has had bowel movements since the procedure, no bleeding, states he does not need Dr Ardis Hughs to call him now but he will call back if persists or causes any continued pain, pain is a 3-4 when the cramp happens, but otherwise he is not in any pain at all.  Actions: * If pain score is 4 or above: No action needed, pain <4.  1. Have you developed a fever since your procedure? no  2.   Have you had an respiratory symptoms (SOB or cough) since your procedure? no  3.   Have you tested positive for COVID 19 since your procedure no  4.   Have you had any family members/close contacts diagnosed with the COVID 19 since your procedure?  no   If yes to any of these questions please route to Joylene John, RN and Joella Prince, RN

## 2021-02-12 DIAGNOSIS — F4310 Post-traumatic stress disorder, unspecified: Secondary | ICD-10-CM | POA: Diagnosis not present

## 2021-02-19 ENCOUNTER — Encounter: Payer: Self-pay | Admitting: Gastroenterology

## 2021-03-05 DIAGNOSIS — F4310 Post-traumatic stress disorder, unspecified: Secondary | ICD-10-CM | POA: Diagnosis not present

## 2021-03-19 DIAGNOSIS — F4310 Post-traumatic stress disorder, unspecified: Secondary | ICD-10-CM | POA: Diagnosis not present

## 2021-03-24 DIAGNOSIS — Z20822 Contact with and (suspected) exposure to covid-19: Secondary | ICD-10-CM | POA: Diagnosis not present

## 2021-03-31 DIAGNOSIS — Z20822 Contact with and (suspected) exposure to covid-19: Secondary | ICD-10-CM | POA: Diagnosis not present

## 2021-04-02 ENCOUNTER — Encounter: Payer: Self-pay | Admitting: Family Medicine

## 2021-04-03 ENCOUNTER — Telehealth (INDEPENDENT_AMBULATORY_CARE_PROVIDER_SITE_OTHER): Payer: BC Managed Care – PPO | Admitting: Family Medicine

## 2021-04-03 ENCOUNTER — Encounter: Payer: Self-pay | Admitting: Family Medicine

## 2021-04-03 ENCOUNTER — Other Ambulatory Visit (HOSPITAL_COMMUNITY): Payer: Self-pay

## 2021-04-03 VITALS — Temp 97.5°F | Wt 170.0 lb

## 2021-04-03 DIAGNOSIS — U071 COVID-19: Secondary | ICD-10-CM

## 2021-04-03 MED ORDER — MOLNUPIRAVIR EUA 200MG CAPSULE
4.0000 | ORAL_CAPSULE | Freq: Two times a day (BID) | ORAL | 0 refills | Status: AC
  Filled 2021-04-03: qty 40, 5d supply, fill #0

## 2021-04-03 NOTE — Progress Notes (Signed)
[facility] [address] [address] Dept: [phone] Dept Fax: [phone]  Virtual Video Visit  I connected with Patrick Jupiter on 04/03/21 at  8:00 AM EDT by a video enabled telemedicine application and verified that I am speaking with the correct person using two identifiers.  Location patient: Home Location provider: Clinic Persons participating in the virtual visit: Patient, Provider  I discussed the limitations of evaluation and management by telemedicine and the availability of in person appointments. The patient expressed understanding and agreed to proceed.  Chief Complaint  Patient presents with  . Acute Visit    C/o having sinus/chest congestion, body aches, Ha's, weakness, SOB x 1 week.  He has take Ibuprofen with little relief.   Would like an RX for antiviral.      SUBJECTIVE:  HPI: Christopher Leonard is a 66 y.o. male who presents with a 4-day history of sinus and chest congestion, myalgias, headaches, weakness, and mild dyspnea on exertion. He notes he did a home COVID test on Tuesday, which was negative. He went to [facility] for a PCR test, which was positive. He has been using some occasional ibuprofen. He notes his symptoms are a little better today. 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  Patient Active Problem List   Diagnosis Date Noted  . Alcohol dependence in remission ([place]) 06/25/2020  . BPH (benign prostatic hyperplasia) 02/11/2020  . Pain in joint of right shoulder 11/07/2019  . Hyperlipidemia 06/20/2019  . Lipoma of face 03/15/2019  . Onychomycosis 06/08/2018  . History of adenomatous polyp of colon 12/28/2016  . Erectile dysfunction 12/28/2016  . Migraine headache without aura 09/10/2013  . HPV in male 03/12/2010  . Depression 10/08/2008  . Chronic hepatitis C without hepatic coma ([facility]) 09/06/2007   Past Surgical History:  Procedure Laterality Date  . COLONOSCOPY    . INGUINAL HERNIA  REPAIR     right  . POLYPECTOMY    . STEM cell therapy shoulder     with sedation  . WISDOM TOOTH EXTRACTION     Family History  Problem Relation Age of Onset  . Renal cancer Mother        right nephrectomy. mets to lung- oral meds for that  . Non-Hodgkin's lymphoma Mother        chemo  . Hypertension Mother   . Other Mother        TIA  . Parkinson's disease Father        died age 51. complications from parkinsons. 20 years.   . Arthritis Sister        hip replacement  . Rheumatic fever Brother        cataracts at 59, not sure about heart issues  . Colon cancer Maternal Grandfather   . Heart attack Maternal Grandfather        age 41 reported 02/11/20 visit  . Pancreatic cancer Sister        doing well post surgery  . Esophageal cancer Neg Hx   . Liver cancer Neg Hx   . Rectal cancer Neg Hx   . Stomach cancer Neg Hx   . Colon polyps Neg Hx    Social History   Tobacco Use  . Smoking status: Former Smoker    Packs/day: 1.00    Years: 10.00    Pack years: 10.00    Types: Cigarettes    Quit date: 07/18/1982    Years since quitting: 38.7  .  Smokeless tobacco: Former Network engineer Use Topics  . Alcohol use: No    Comment: 26 - last drink. was alcoholic  . Drug use: No    Current Outpatient Medications:  .  citalopram (CELEXA) 10 MG tablet, Take 1 tablet (10 mg total) by mouth daily., Disp: 90 tablet, Rfl: 3 .  Multiple Vitamin (MULTIVITAMIN) tablet, Take 1 tablet by mouth daily., Disp: , Rfl:  .  Omega-3 1000 MG CAPS, Take by mouth., Disp: , Rfl:  .  tadalafil (CIALIS) 5 MG tablet, Take 1 tablet (5 mg total) by mouth daily. For bph., Disp: 100 tablet, Rfl: 3 .  molnupiravir EUA 200 mg CAPS, Take 4 capsules (800 mg total) by mouth 2 (two) times daily for 5 days., Disp: 40 capsule, Rfl: 0  Allergies  Allergen Reactions  . Poison Ivy Extract    ROS: See pertinent positives and negatives per HPI.  OBSERVATIONS/OBJECTIVE:  VITALS per patient if applicable: Today's  Vitals   04/03/21 0802  Temp: (!) 97.5 F (36.4 C)  TempSrc: Oral  Weight: 170 lb (77.1 kg)   Body mass index is 24.39 kg/m.   GENERAL: Alert and oriented. Appears well and in no acute distress.  HEENT: Atraumatic. Conjunctiva clear. No obvious abnormalities on inspection of external nose and ears.  NECK: Normal movements of the head and neck.  LUNGS: On inspection, no signs of respiratory distress. Breathing rate appears normal. No obvious gross SOB, gasping or wheezing, and no conversational dyspnea. No cough.  CV: No obvious cyanosis.  MS: Moves all visible extremities without noticeable abnormality.  PSYCH/NEURO: Pleasant and cooperative. No obvious depression or anxiety. Speech and thought processing grossly intact.  ASSESSMENT AND PLAN:  1. COVID-19 Reviewed home care instructions for COVID. Advised self-isolation at home for at least 5 days. After 5 days, if improved and fever resolved, can be in public, but should wear a mask around others for an additional 5 days. If symptoms, esp, dyspnea develops/worsens, recommend in-person evaluation at either an urgent care, emergency room.  - molnupiravir EUA 200 mg CAPS; Take 4 capsules (800 mg total) by mouth 2 (two) times daily for 5 days.  Dispense: 40 capsule; Refill: 0  I discussed the assessment and treatment plan with the patient. The patient was provided an opportunity to ask questions and all were answered. The patient agreed with the plan and demonstrated an understanding of the instructions.   The patient was advised to call back or seek an in-person evaluation if the symptoms worsen or if the condition fails to improve as anticipated.   Haydee Salter, MD

## 2021-04-16 DIAGNOSIS — F4310 Post-traumatic stress disorder, unspecified: Secondary | ICD-10-CM | POA: Diagnosis not present

## 2021-05-07 DIAGNOSIS — F4310 Post-traumatic stress disorder, unspecified: Secondary | ICD-10-CM | POA: Diagnosis not present

## 2021-05-14 DIAGNOSIS — S43014A Anterior dislocation of right humerus, initial encounter: Secondary | ICD-10-CM | POA: Diagnosis not present

## 2021-05-14 DIAGNOSIS — M25511 Pain in right shoulder: Secondary | ICD-10-CM | POA: Diagnosis not present

## 2021-05-14 DIAGNOSIS — S43084A Other dislocation of right shoulder joint, initial encounter: Secondary | ICD-10-CM | POA: Diagnosis not present

## 2021-05-14 DIAGNOSIS — X500XXA Overexertion from strenuous movement or load, initial encounter: Secondary | ICD-10-CM | POA: Diagnosis not present

## 2021-05-14 DIAGNOSIS — S4991XA Unspecified injury of right shoulder and upper arm, initial encounter: Secondary | ICD-10-CM | POA: Diagnosis not present

## 2021-05-14 DIAGNOSIS — Y9316 Activity, rowing, canoeing, kayaking, rafting and tubing: Secondary | ICD-10-CM | POA: Diagnosis not present

## 2021-05-14 DIAGNOSIS — Y9289 Other specified places as the place of occurrence of the external cause: Secondary | ICD-10-CM | POA: Diagnosis not present

## 2021-05-18 ENCOUNTER — Emergency Department (HOSPITAL_COMMUNITY)
Admission: EM | Admit: 2021-05-18 | Discharge: 2021-05-18 | Disposition: A | Discharge status: Home / Self Care | Payer: BC Managed Care – PPO | Attending: Emergency Medicine | Admitting: Emergency Medicine

## 2021-05-18 ENCOUNTER — Encounter (HOSPITAL_COMMUNITY): Payer: Self-pay | Admitting: Emergency Medicine

## 2021-05-18 ENCOUNTER — Emergency Department (HOSPITAL_COMMUNITY): Payer: BC Managed Care – PPO

## 2021-05-18 ENCOUNTER — Other Ambulatory Visit: Payer: Self-pay

## 2021-05-18 DIAGNOSIS — M25511 Pain in right shoulder: Secondary | ICD-10-CM | POA: Diagnosis not present

## 2021-05-18 DIAGNOSIS — S4991XA Unspecified injury of right shoulder and upper arm, initial encounter: Secondary | ICD-10-CM | POA: Diagnosis not present

## 2021-05-18 DIAGNOSIS — S43014A Anterior dislocation of right humerus, initial encounter: Secondary | ICD-10-CM | POA: Diagnosis not present

## 2021-05-18 DIAGNOSIS — Z85828 Personal history of other malignant neoplasm of skin: Secondary | ICD-10-CM | POA: Diagnosis not present

## 2021-05-18 DIAGNOSIS — S43004A Unspecified dislocation of right shoulder joint, initial encounter: Secondary | ICD-10-CM | POA: Diagnosis not present

## 2021-05-18 DIAGNOSIS — S4990XA Unspecified injury of shoulder and upper arm, unspecified arm, initial encounter: Secondary | ICD-10-CM

## 2021-05-18 DIAGNOSIS — X58XXXA Exposure to other specified factors, initial encounter: Secondary | ICD-10-CM | POA: Diagnosis not present

## 2021-05-18 DIAGNOSIS — Z87891 Personal history of nicotine dependence: Secondary | ICD-10-CM | POA: Insufficient documentation

## 2021-05-18 MED ORDER — HYDROMORPHONE HCL 1 MG/ML IJ SOLN
1.0000 mg | Freq: Once | INTRAMUSCULAR | Status: AC
Start: 2021-05-18 — End: 2021-05-18
  Administered 2021-05-18: 1 mg via INTRAVENOUS
  Filled 2021-05-18: qty 1

## 2021-05-18 MED ORDER — PROPOFOL 10 MG/ML IV BOLUS
0.5000 mg/kg | Freq: Once | INTRAVENOUS | Status: AC
  Administered 2021-05-18: 38.5 mg via INTRAVENOUS
  Filled 2021-05-18: qty 20

## 2021-05-18 MED ORDER — PROPOFOL 10 MG/ML IV BOLUS
INTRAVENOUS | Status: AC | PRN
  Administered 2021-05-18: 38.55 mg via INTRAVENOUS
  Administered 2021-05-18: 80 mg via INTRAVENOUS

## 2021-05-18 NOTE — ED Notes (Signed)
Unable to sign MSE due to limited mobility

## 2021-05-18 NOTE — ED Notes (Signed)
80 mg propofol administered TOTAL throughout conscious sedation.

## 2021-05-18 NOTE — Discharge Instructions (Addendum)
You are seen in the emergency department for recurrent right shoulder dislocation.  you received procedural sedation and had a closed reduction.  Please stay in the sling and avoid raising your arm above shoulder level.  Ibuprofen for pain.  Follow-up with your orthopedic doctor.  Return if any worsening or concerning symptoms

## 2021-05-18 NOTE — ED Triage Notes (Signed)
Pt reports he dislocated his right shoulder on Thursday and had it reduced at a hospital in [place]. He is here today because he has re-dislocated the same shoulder.

## 2021-05-18 NOTE — ED Provider Notes (Signed)
[facility] DEPT Provider Note   CSN: [id number] Arrival date & time: 05/18/21  1923     History Chief Complaint  Patient presents with   Shoulder Injury    Christopher Leonard is a 65 y.o. male.  He is right-hand dominant.  Here with recurrent right shoulder dislocation.  He said he dislocated it 30 years ago and again few days ago.  He was putting his kayak away.  Complaining of severe right shoulder pain.  No numbness or weakness.  Last ate 8 hours ago.  Denies any medication allergies.  The history is provided by the patient.  Shoulder Injury This is a recurrent problem. The current episode started 1 to 2 hours ago. The problem occurs constantly. The problem has not changed since onset.Pertinent negatives include no chest pain, no abdominal pain, no headaches and no shortness of breath. The symptoms are aggravated by bending and twisting. Nothing relieves the symptoms. He has tried nothing for the symptoms. The treatment provided no relief.      Past Medical History:  Diagnosis Date   Anxiety    Cancer ([place])    basal cell removed from arm    Depression    Hepatitis C    Persistent headaches    Shoulder pain, right    torn rotator cuff- putting off   Substance abuse [facility])     Patient Active Problem List   Diagnosis Date Noted   Alcohol dependence in remission ([place]) 06/25/2020   BPH (benign prostatic hyperplasia) 02/11/2020   Pain in joint of right shoulder 11/07/2019   Hyperlipidemia 06/20/2019   Lipoma of face 03/15/2019   Onychomycosis 06/08/2018   History of adenomatous polyp of colon 12/28/2016   Erectile dysfunction 12/28/2016   Migraine headache without aura 09/10/2013   HPV in male 03/12/2010   Depression 10/08/2008   Chronic hepatitis C without hepatic coma ([place]) 09/06/2007    Past Surgical History:  Procedure Laterality Date   COLONOSCOPY     INGUINAL HERNIA REPAIR     right   POLYPECTOMY     STEM cell therapy shoulder      with sedation   WISDOM TOOTH EXTRACTION         Family History  Problem Relation Age of Onset   Renal cancer Mother        right nephrectomy. mets to lung- oral meds for that   Non-Hodgkin's lymphoma Mother        chemo   Hypertension Mother    Other Mother        TIA   Parkinson's disease Father        died age 25. complications from parkinsons. 20 years.    Arthritis Sister        hip replacement   Rheumatic fever Brother        cataracts at 13, not sure about heart issues   Colon cancer Maternal Grandfather    Heart attack Maternal Grandfather        age 52 reported 02/11/20 visit   Pancreatic cancer Sister        doing well post surgery   Esophageal cancer Neg Hx    Liver cancer Neg Hx    Rectal cancer Neg Hx    Stomach cancer Neg Hx    Colon polyps Neg Hx     Social History   Tobacco Use   Smoking status: Former    Packs/day: 1.00    Years: 10.00    Pack  years: 10.00    Types: Cigarettes    Quit date: 07/18/1982    Years since quitting: 38.8   Smokeless tobacco: Former  Substance Use Topics   Alcohol use: No    Comment: 26 - last drink. was alcoholic   Drug use: No    Home Medications Prior to Admission medications   Medication Sig Start Date End Date Taking? Authorizing Provider  citalopram (CELEXA) 10 MG tablet Take 1 tablet (10 mg total) by mouth daily. 06/25/20   Marin Olp, MD  Multiple Vitamin (MULTIVITAMIN) tablet Take 1 tablet by mouth daily.    [provider]  Omega-3 1000 MG CAPS Take by mouth.    [provider]  tadalafil (CIALIS) 5 MG tablet Take 1 tablet (5 mg total) by mouth daily. For bph. 12/08/20   Marin Olp, MD    Allergies    Poison ivy extract  Review of Systems   Review of Systems  Constitutional:  Negative for fever.  HENT:  Negative for sore throat.   Eyes:  Negative for visual disturbance.  Respiratory:  Negative for shortness of breath.   Cardiovascular:  Negative for chest pain.   Gastrointestinal:  Negative for abdominal pain.  Genitourinary:  Negative for dysuria.  Musculoskeletal:  Negative for neck pain.  Skin:  Negative for rash and wound.  Neurological:  Negative for weakness, numbness and headaches.   Physical Exam Updated Vital Signs BP (!) 156/101 (BP Location: Left Arm)   Pulse 67   Temp 98.9 F (37.2 C) (Oral)   Resp 16   SpO2 100%   Physical Exam Vitals and nursing note reviewed.  Constitutional:      Appearance: Normal appearance. He is well-developed.  HENT:     Head: Normocephalic and atraumatic.  Eyes:     Conjunctiva/sclera: Conjunctivae normal.  Cardiovascular:     Rate and Rhythm: Normal rate and regular rhythm.     Heart sounds: No murmur heard. Pulmonary:     Effort: Pulmonary effort is normal. No respiratory distress.     Breath sounds: Normal breath sounds.  Abdominal:     Palpations: Abdomen is soft.     Tenderness: There is no abdominal tenderness.  Musculoskeletal:        General: Tenderness, deformity and signs of injury present.     Cervical back: Neck supple.     Comments: Clinically has anterior shoulder dislocation on the right.  He has normal axillary sensation and distal radial pulses strong.  Distal motor and sensation intact.  Skin:    General: Skin is warm and dry.  Neurological:     General: No focal deficit present.     Mental Status: He is alert.     Sensory: No sensory deficit.     Motor: No weakness.    ED Results / Procedures / Treatments   Labs (all labs ordered are listed, but only abnormal results are displayed) Labs Reviewed - No data to display  EKG None  Radiology DG Shoulder Right Portable  Result Date: 05/18/2021 CLINICAL DATA:  Status post reduction of right shoulder dislocation. EXAM: PORTABLE RIGHT SHOULDER COMPARISON:  Earlier same day FINDINGS: Two-view exam shows interval reduction of the humeral head dislocation. Suspicion for associated Hill-Sachs deformity. No definite glenoid  fracture. Acromioclavicular and coracoclavicular distances are preserved. IMPRESSION: Interval reduction of right shoulder dislocation. Electronically Signed   By: Misty Stanley M.D.   On: 05/18/2021 20:48   DG Shoulder Right Port  Result Date:  05/18/2021 CLINICAL DATA:  Right shoulder pain. Dislocated shoulder 4 days ago. EXAM: PORTABLE RIGHT SHOULDER COMPARISON:  None. FINDINGS: Anterior shoulder dislocation. Question Bankart fracture and Hill-Sachs deformity with limited evaluation due to overlying osseous structures. IMPRESSION: 1. Anterior shoulder dislocation. 2. Question Bankart fracture and Hill-Sachs deformity with limited evaluation due to overlying osseous structures. Electronically Signed   By: Iven Finn M.D.   On: 05/18/2021 19:58    Procedures .Sedation  Date/Time: 05/18/2021 8:34 PM Performed by: Hayden Rasmussen, MD Authorized by: Hayden Rasmussen, MD   Consent:    Consent obtained:  Written   Consent given by:  Patient   Risks discussed:  Prolonged hypoxia resulting in organ damage, prolonged sedation necessitating reversal, inadequate sedation, respiratory compromise necessitating ventilatory assistance and intubation, vomiting, nausea, allergic reaction and dysrhythmia   Alternatives discussed:  Analgesia without sedation Universal protocol:    Procedure explained and questions answered to patient or proxy's satisfaction: yes     Relevant documents present and verified: yes     Test results available: yes     Imaging studies available: yes     Required blood products, implants, devices, and special equipment available: yes     Immediately prior to procedure, a time out was called: yes     Patient identity confirmed:  Arm band and verbally with patient Indications:    Procedure performed:  Dislocation reduction   Procedure necessitating sedation performed by:  Physician performing sedation Pre-sedation assessment:    Time since last food or drink:  8   ASA  classification: class 2 - patient with mild systemic disease     Mouth opening:  3 or more finger widths   Thyromental distance:  4 finger widths   Mallampati score:  I - soft palate, uvula, fauces, pillars visible   Neck mobility: normal     Pre-sedation assessments completed and reviewed: airway patency, cardiovascular function, hydration status, mental status, nausea/vomiting, pain level, respiratory function and temperature     Pre-sedation assessment completed:  05/18/2021 8:15 PM Immediate pre-procedure details:    Reassessment: Patient reassessed immediately prior to procedure     Reviewed: vital signs, relevant labs/tests and NPO status     Verified: bag valve mask available, emergency equipment available, intubation equipment available, IV patency confirmed, oxygen available and suction available   Procedure details (see MAR for exact dosages):    Preoxygenation:  Nasal cannula   Sedation:  Propofol   Intended level of sedation: deep   Intra-procedure monitoring:  Blood pressure monitoring, cardiac monitor, frequent vital sign checks, continuous pulse oximetry, continuous capnometry and frequent LOC assessments   Total Provider sedation time (minutes):  15 Post-procedure details:    Post-sedation assessment completed:  05/18/2021 9:04 PM   Attendance: Constant attendance by certified staff until patient recovered     Recovery: Patient returned to pre-procedure baseline     Post-sedation assessments completed and reviewed: airway patency, cardiovascular function, hydration status, mental status, nausea/vomiting, pain level, respiratory function and temperature     Patient is stable for discharge or admission: yes     Procedure completion:  Tolerated well, no immediate complications .Ortho Injury Treatment  Date/Time: 05/18/2021 8:36 PM Performed by: Hayden Rasmussen, MD Authorized by: Hayden Rasmussen, MD   Consent:    Consent obtained:  Written   Consent given by:  Patient    Risks discussed:  Fracture, irreducible dislocation, recurrent dislocation, restricted joint movement, nerve damage and vascular damage   Alternatives discussed:  Alternative treatment, delayed treatment, referral, immobilization and no treatmentInjury location: shoulder Location details: right shoulder Injury type: dislocation Dislocation type: anterior Hill-Sachs deformity: yes Chronicity: recurrent Pre-procedure neurovascular assessment: neurovascularly intact Pre-procedure distal perfusion: normal Pre-procedure neurological function: normal Pre-procedure range of motion: reduced  Patient sedated: Yes. Refer to sedation procedure documentation for details of sedation. Manipulation performed: yes Reduction method: traction and counter traction Reduction successful: yes X-ray confirmed reduction: yes Immobilization: sling Post-procedure neurovascular assessment: post-procedure neurovascularly intact Post-procedure distal perfusion: normal Post-procedure neurological function: normal Post-procedure range of motion: improved     Medications Ordered in ED Medications  HYDROmorphone (DILAUDID) injection 1 mg (1 mg Intravenous Given 05/18/21 1956)  propofol (DIPRIVAN) 10 mg/mL bolus/IV push 38.5 mg (38.5 mg Intravenous Given 05/18/21 2018)  propofol (DIPRIVAN) 10 mg/mL bolus/IV push (38.55 mg Intravenous Given 05/18/21 2042)    ED Course  I have reviewed the triage vital signs and the nursing notes.  Pertinent labs & imaging results that were available during my care of the patient were reviewed by me and considered in my medical decision making (see chart for details).  Clinical Course as of 05/19/21 1135  Mon May 18, 2021  2000 Patient has anterior shoulder dislocation. [MB]    Clinical Course User Index [MB] Hayden Rasmussen, MD   MDM Rules/Calculators/A&P                         Patient here with right shoulder pain in the setting of recent dislocation.  Clinically has right  shoulder dislocation.  X-rays confirm such.  Patient consented for deep sedation and closed reduction.  Under close cardiovascular monitoring patient was sedated and reduced.  He feels much better after procedure.  His wife is here to drive him home.  He is neurovascularly intact at time of discharge.  He has a follow-up orthopedist appointment coming up Thursday with Dr. Alma Friendly.  Return instructions discussed.  He declines any pain medication  Final Clinical Impression(s) / ED Diagnoses Final diagnoses:  Shoulder injury  Dislocation of right shoulder joint, initial encounter    Rx / DC Orders ED Discharge Orders     None        Hayden Rasmussen, MD 05/19/21 1139

## 2021-05-21 DIAGNOSIS — M25511 Pain in right shoulder: Secondary | ICD-10-CM | POA: Diagnosis not present

## 2021-05-21 DIAGNOSIS — M25512 Pain in left shoulder: Secondary | ICD-10-CM | POA: Diagnosis not present

## 2021-05-21 DIAGNOSIS — M75121 Complete rotator cuff tear or rupture of right shoulder, not specified as traumatic: Secondary | ICD-10-CM | POA: Diagnosis not present

## 2021-05-21 DIAGNOSIS — F4310 Post-traumatic stress disorder, unspecified: Secondary | ICD-10-CM | POA: Diagnosis not present

## 2021-05-26 DIAGNOSIS — M25511 Pain in right shoulder: Secondary | ICD-10-CM | POA: Diagnosis not present

## 2021-05-26 DIAGNOSIS — M25512 Pain in left shoulder: Secondary | ICD-10-CM | POA: Diagnosis not present

## 2021-06-10 DIAGNOSIS — F4310 Post-traumatic stress disorder, unspecified: Secondary | ICD-10-CM | POA: Diagnosis not present

## 2021-06-15 DIAGNOSIS — M25511 Pain in right shoulder: Secondary | ICD-10-CM | POA: Diagnosis not present

## 2021-06-15 DIAGNOSIS — M25811 Other specified joint disorders, right shoulder: Secondary | ICD-10-CM | POA: Diagnosis not present

## 2021-06-15 DIAGNOSIS — M25512 Pain in left shoulder: Secondary | ICD-10-CM | POA: Diagnosis not present

## 2021-06-24 DIAGNOSIS — F4310 Post-traumatic stress disorder, unspecified: Secondary | ICD-10-CM | POA: Diagnosis not present

## 2021-06-25 ENCOUNTER — Ambulatory Visit (INDEPENDENT_AMBULATORY_CARE_PROVIDER_SITE_OTHER): Payer: BC Managed Care – PPO | Admitting: Family Medicine

## 2021-06-25 ENCOUNTER — Encounter: Payer: Self-pay | Admitting: Family Medicine

## 2021-06-25 ENCOUNTER — Other Ambulatory Visit: Payer: Self-pay

## 2021-06-25 VITALS — BP 114/79 | HR 55 | Temp 98.7°F | Ht 70.0 in | Wt 167.6 lb

## 2021-06-25 DIAGNOSIS — Z125 Encounter for screening for malignant neoplasm of prostate: Secondary | ICD-10-CM

## 2021-06-25 DIAGNOSIS — Z01818 Encounter for other preprocedural examination: Secondary | ICD-10-CM

## 2021-06-25 DIAGNOSIS — F325 Major depressive disorder, single episode, in full remission: Secondary | ICD-10-CM

## 2021-06-25 DIAGNOSIS — B182 Chronic viral hepatitis C: Secondary | ICD-10-CM

## 2021-06-25 DIAGNOSIS — Z1159 Encounter for screening for other viral diseases: Secondary | ICD-10-CM | POA: Diagnosis not present

## 2021-06-25 DIAGNOSIS — G43009 Migraine without aura, not intractable, without status migrainosus: Secondary | ICD-10-CM

## 2021-06-25 DIAGNOSIS — E785 Hyperlipidemia, unspecified: Secondary | ICD-10-CM | POA: Diagnosis not present

## 2021-06-25 DIAGNOSIS — Z0000 Encounter for general adult medical examination without abnormal findings: Secondary | ICD-10-CM

## 2021-06-25 LAB — CBC WITH DIFFERENTIAL/PLATELET
Basophils Absolute: 0.1 10*3/uL (ref 0.0–0.1)
Basophils Relative: 0.8 % (ref 0.0–3.0)
Eosinophils Absolute: 0.3 10*3/uL (ref 0.0–0.7)
Eosinophils Relative: 4.4 % (ref 0.0–5.0)
HCT: 47.9 % (ref 39.0–52.0)
Hemoglobin: 16.5 g/dL (ref 13.0–17.0)
Lymphocytes Relative: 30 % (ref 12.0–46.0)
Lymphs Abs: 1.9 10*3/uL (ref 0.7–4.0)
MCHC: 34.4 g/dL (ref 30.0–36.0)
MCV: 93.6 fl (ref 78.0–100.0)
Monocytes Absolute: 0.5 10*3/uL (ref 0.1–1.0)
Monocytes Relative: 8 % (ref 3.0–12.0)
Neutro Abs: 3.6 10*3/uL (ref 1.4–7.7)
Neutrophils Relative %: 56.8 % (ref 43.0–77.0)
Platelets: 211 10*3/uL (ref 150.0–400.0)
RBC: 5.12 Mil/uL (ref 4.22–5.81)
RDW: 13 % (ref 11.5–15.5)
WBC: 6.3 10*3/uL (ref 4.0–10.5)

## 2021-06-25 LAB — POC URINALSYSI DIPSTICK (AUTOMATED)
Bilirubin, UA: NEGATIVE
Blood, UA: NEGATIVE
Glucose, UA: NEGATIVE
Ketones, UA: NEGATIVE
Leukocytes, UA: NEGATIVE
Nitrite, UA: NEGATIVE
Protein, UA: NEGATIVE
Spec Grav, UA: 1.02 (ref 1.010–1.025)
Urobilinogen, UA: 0.2 E.U./dL
pH, UA: 6 (ref 5.0–8.0)

## 2021-06-25 LAB — COMPREHENSIVE METABOLIC PANEL
ALT: 9 U/L (ref 0–53)
AST: 14 U/L (ref 0–37)
Albumin: 4.4 g/dL (ref 3.5–5.2)
Alkaline Phosphatase: 64 U/L (ref 39–117)
BUN: 24 mg/dL — ABNORMAL HIGH (ref 6–23)
CO2: 29 mEq/L (ref 19–32)
Calcium: 9.9 mg/dL (ref 8.4–10.5)
Chloride: 103 mEq/L (ref 96–112)
Creatinine, Ser: 1.08 mg/dL (ref 0.40–1.50)
GFR: 72.65 mL/min (ref >60.00)
Glucose, Bld: 80 mg/dL (ref 70–99)
Potassium: 4.5 mEq/L (ref 3.5–5.1)
Sodium: 140 mEq/L (ref 135–145)
Total Bilirubin: 0.9 mg/dL (ref 0.2–1.2)
Total Protein: 7.2 g/dL (ref 6.0–8.3)

## 2021-06-25 LAB — LIPID PANEL
Cholesterol: 184 mg/dL (ref 0–200)
HDL: 43.9 mg/dL (ref >39.00)
LDL Cholesterol: 127 mg/dL — ABNORMAL HIGH (ref 0–99)
NonHDL: 140.07
Total CHOL/HDL Ratio: 4
Triglycerides: 66 mg/dL (ref 0.0–149.0)
VLDL: 13.2 mg/dL (ref 0.0–40.0)

## 2021-06-25 LAB — PSA: PSA: 2.74 ng/mL (ref 0.10–4.00)

## 2021-06-25 LAB — PROTIME-INR
INR: 1.1 ratio — ABNORMAL HIGH (ref 0.8–1.0)
Prothrombin Time: 11.7 s (ref 9.6–13.1)

## 2021-06-25 NOTE — Patient Instructions (Addendum)
Health Maintenance Due  Topic Date Due   Zoster Vaccines- Shingrix (1 of 2) - hold off for now with upcoming surgery Never done   COVID-19 Vaccine (4 - Booster for Moderna series) Recently had covid- wait 4 months from covid  03/23/2021   INFLUENZA VACCINE Patient will get this done at work in October. Please let us know if you receive. 06/15/2021   Please stop by lab before you go If you have mychart- we will send your results within 3 business days of [place] receiving them.  If you do not have mychart- we will call you about results within 5 business days of [place] receiving them.  *please also note that you will see labs on mychart as soon as they post. I will later go in and write notes on them- will say "notes from Dr. Yong Channel"  We will call you within two weeks about your referral to coronary artery calcium scoring. If you do not hear within 2 weeks, give [place] a call.   Recommended follow up: Return in about 1 year (around 06/25/2022) for a physical.

## 2021-06-25 NOTE — Addendum Note (Signed)
Addended by: Loura Back on: 06/25/2021 09:28 AM   Modules accepted: Orders

## 2021-06-25 NOTE — Progress Notes (Signed)
Phone: [phone]   Subjective:  Patient presents today for their annual physical. Chief complaint-noted.   See problem oriented charting- ROS- full  review of systems was completed and negative  except for: right shoulder pain with history of dislocation, left shoulder has tear as well, some hip pain as well  The following were reviewed and entered/updated in epic: Past Medical History:  Diagnosis Date   Anxiety    Cancer ([place])    basal cell removed from arm    Depression    Hepatitis C    Persistent headaches    Shoulder pain, right    torn rotator cuff- putting off   Substance abuse [facility])    Patient Active Problem List   Diagnosis Date Noted   Alcohol dependence in remission ([place]) 06/25/2020    Priority: High   Chronic hepatitis C without hepatic coma ([place]) 09/06/2007    Priority: High   BPH (benign prostatic hyperplasia) 02/11/2020    Priority: Medium   Hyperlipidemia 06/20/2019    Priority: Medium   Migraine headache without aura 09/10/2013    Priority: Medium   Depression 10/08/2008    Priority: Medium   Lipoma of face 03/15/2019    Priority: Low   Onychomycosis 06/08/2018    Priority: Low   History of adenomatous polyp of colon 12/28/2016    Priority: Low   Erectile dysfunction 12/28/2016    Priority: Low   HPV in male 03/12/2010    Priority: Low   Pain in joint of right shoulder 11/07/2019   Past Surgical History:  Procedure Laterality Date   COLONOSCOPY     INGUINAL HERNIA REPAIR     right   POLYPECTOMY     STEM cell therapy shoulder     with sedation   WISDOM TOOTH EXTRACTION      Family History  Problem Relation Age of Onset   Renal cancer Mother        right nephrectomy. mets to lung- oral meds for that   Non-Hodgkin's lymphoma Mother        chemo   Hypertension Mother    Other Mother        TIA   Parkinson's disease Father        died age 7. complications from parkinsons. 20 years.    Arthritis Sister        hip replacement    Rheumatic fever Brother        cataracts at 29, not sure about heart issues   Colon cancer Maternal Grandfather    Heart attack Maternal Grandfather        age 53 reported 02/11/20 visit   Pancreatic cancer Sister        doing well post surgery   Esophageal cancer Neg Hx    Liver cancer Neg Hx    Rectal cancer Neg Hx    Stomach cancer Neg Hx    Colon polyps Neg Hx     Medications- reviewed and updated Current Outpatient Medications  Medication Sig Dispense Refill   acetaminophen (TYLENOL) 325 MG tablet Take 325-650 mg by mouth every 6 (six) hours as needed for mild pain (or headaches).     citalopram (CELEXA) 10 MG tablet Take 1 tablet (10 mg total) by mouth daily. 90 tablet 3   ibuprofen (ADVIL) 200 MG tablet Take 200-400 mg by mouth every 6 (six) hours as needed for mild pain (or headaches).     Multiple Vitamin (MULTIVITAMIN) tablet Take 1 tablet by mouth daily.  Omega-3 1000 MG CAPS Take 1,000 mg by mouth daily.     tadalafil (CIALIS) 5 MG tablet Take 1 tablet (5 mg total) by mouth daily. For bph. (Patient taking differently: Take 5 mg by mouth daily.) 100 tablet 3   No current facility-administered medications for this visit.    Allergies-reviewed and updated Allergies  Allergen Reactions   Poison Ivy Extract Itching and Rash    Social History   Social History Narrative   GF. 1 daughter age 65- in town. No grandkids.       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       Hobbies: rest, white water kayaker in past- shoulder prevents   Objective  Objective:  BP 114/79   Pulse (!) 55   Temp 98.7 F (37.1 C) (Temporal)   Ht '5\' 10"'$  (1.778 m)   Wt 167 lb 9.6 oz (76 kg)   SpO2 96%   BMI 24.05 kg/m  Gen: NAD, resting comfortably HEENT: Mucous membranes are moist. Oropharynx normal Neck: no thyromegaly CV: RRR no murmurs rubs or gallops Lungs: CTAB no crackles, wheeze, rhonchi Abdomen:  soft/nontender/nondistended/normal bowel sounds. No rebound or guarding.  Ext: no edema Skin: warm, dry Neuro: grossly normal, moves all extremities, PERRLA    Assessment and Plan  65 y.o. male presenting for annual physical.  Health Maintenance counseling: 1. Anticipatory guidance: Patient counseled regarding regular dental exams -q6 months, eye exams -yearly with Dr. Gershon Crane,  avoiding smoking and second hand smoke , limiting alcohol to 0 beverages per day -recovered alcoholic - no drinks in 38 years.   2. Risk factor reduction:  Advised patient of need for regular exercise and diet rich and fruits and vegetables to reduce risk of heart attack and stroke. Exercise- walks dogs twice a day for an hour- still doing some hiking on more challenging trails up to 5 miles . Diet-reasonably healthy- GF cooked pretty healthy- when eats at work not at clear.  Weight largely stable over the last few years from 1 65-1 70 Wt Readings from Last 3 Encounters:  06/25/21 167 lb 9.6 oz (76 kg)  04/03/21 170 lb (77.1 kg)  02/09/21 167 lb (75.8 kg)  3. Immunizations/screenings/ancillary studies- Discussed Prevnar 20-would wait until he gets to 66 , Shingrix- hold off with upcoming surgery, Flu vaccination- ask him to let us know when he gets this in the fall-we do not have this yet available, and 4th COVID-19 booster- going to hold 4 months after covid - otherwise up-to-date. Immunization History  Administered Date(s) Administered   Hepatitis A, Adult 04/13/2017, 09/26/2017   Hepatitis B, adult 04/13/2017, 05/23/2017, 09/26/2017   Influenza Whole 08/15/2009, 08/16/2019   Influenza-Unspecified 08/18/2016, 08/15/2020   Moderna Sars-Covid-2 Vaccination 11/20/2019, 12/17/2019, 11/23/2020   Pneumococcal Polysaccharide-23 03/09/2017   Td 11/15/2001   Tdap 05/30/2012  4. Prostate cancer screening- Some BPH on prior exams- trend PSA. 1-2x a night nocturia. Had tried cialis in the past to see if covered for daily use  - found this to help quite a bit. .  Patient did have prior elevated PSA in August of last year but thankfully this trended back down-we will send again with labs today Lab Results  Component Value Date   PSA 2.48 12/08/2020   PSA 3.2 06/26/2020   PSA 2.72 06/20/2019   5. Colon cancer screening - 02/09/2021 with a 5 year repeat planned 6. Skin cancer screening- saw dermatology 3 years ago.  advised regular sunscreen use. Denies worrisome, changing, or new skin lesions.  7. former smoker- >30 years cigarette free. At 44 get AAA 8. STD screening - opts  out- monogamous with GF - patient did have a history of possible warts years ago but no new lesions.  Status of chronic or acute concerns   # Hospital F/U for Dislocation of Right Shoulder Joint S:Patient was seen in the ED 05/18/2021 for recurrent right shoulder dislocation. c. Reported that he had dislocated it 30 years ago and again prior to the visit - putting away his kayak. He complained of severe right shoulder pain.   X-rays confirmed the dislocation - Interval reduction of right shoulder dislocation with questions of this being Bankart fracture and Hill-Sachs deformity with limited evaluation due to overlying osseous structures.  Patient did consent for deep sedation and closed reduction.  Under close cardiovascular monitoring patient was sedated and reduced.  He reported feeling much better after the procedure.Patient also declined any pain medication.  He did have a follow-up orthopedist appointment with Dr. Alma Friendly and Dr. Onnie Graham with orthopedics   Has surgery scheduled- we have clearance form for him from [facility]. Dr. Onnie Graham doing surgery.   A/P: Unfortunately patient with multiple dislocations and requires upcoming right reverse shoulder arthroplasty-I have a form from [facility] which I am going to fax them today.  Patient is able to complete 4 METS of activity without chest pain or shortness of breath-he does not need  further cardiac work-up at this time-we are going to update lab work but I am not concerned based off his history-we will go ahead and submit for  #Prior Right shoulder pain-patient was treated  [facility] in [place] with bio restorative orthopedic autologous stem cell proecedure in 2021. Reports  had been doing well until dislocations  #COVID-19-treated in May for this with molnupiravir (no side effects)-patient reports increased fatigue/sleep since that time (main issues is lingering desire to sleep more). No lingering cough or brain fog . Was out of work 4 days.   # Depression S: Medication:Celexa 10 mg daily Depression screen [facility] 2/9 06/25/2021 12/08/2020 06/25/2020  Decreased Interest 0 0 0  Down, Depressed, Hopeless 0 0 0  PHQ - 2 Score 0 0 0  Altered sleeping 0 0 0  Tired, decreased energy 1 0 0  Change in appetite 0 0 0  Feeling bad or failure about yourself  0 0 0  Trouble concentrating 0 0 0  Moving slowly or fidgety/restless 0 0 0  Suicidal thoughts 0 0 0  PHQ-9 Score 1 0 0  Difficult doing work/chores Not difficult at all Not difficult at all Not difficult at all  A/P: Full remission-continue current medication  #hyperlipidemia S: Medication:flaxseed oil. Paternal grandfather had a heart attack at 41 -patient was interested in a coronary calcium scoring and fenofibrate.   Lab Results  Component Value Date   CHOL 189 06/26/2020   HDL 47 06/26/2020   LDLCALC 124 (H) 06/26/2020   LDLDIRECT 141.1 10/01/2008   TRIG 79 06/26/2020   CHOLHDL 4.0 06/26/2020   A/P: 10-year ASCVD risk over 7.5%-we discussed getting more information with coronary artery calcium scoring plus update lipids today    # chronic hepatitis C- seen by ID in 2018 and considered cured after treatment -we will check LFTs and hepatitis C RNA   #Migraine- patient took Maxalt as needed in past.  reported being better in the last few years - off for 4 years  other  than 1 recently after covid- maxalt  cleared this   #Erectile dysfunction/BPH-Cialis 5 mg daily - helpful for BPH and ED   #Palpitations-EKG reassuring last year . No issues since that time   Recommended follow up: No follow-ups on file.  Lab/Order associations: fasting   ICD-10-CM   1. Preventative health care  Z00.00     2. Major depressive disorder with single episode, in full remission ([place])  F32.5     3. Hyperlipidemia, unspecified hyperlipidemia type  E78.5 CBC with Differential/Platelet    Comprehensive metabolic panel    Lipid panel    POCT Urinalysis Dipstick (Automated)    CT CARDIAC SCORING (SELF PAY ONLY)    4. Chronic hepatitis C without hepatic coma (HCC)  B18.2 Hepatitis C RNA quantitative ([facility])    Protime-INR    5. Migraine without aura and without status migrainosus, not intractable  G43.009     6. Preoperative clearance  Z01.818 Protime-INR    7. Screening for prostate cancer  Z12.5 PSA      No orders of the defined types were placed in this encounter.  I,Harris Phan,acting as a Education administrator for Garret Reddish, MD.,have documented all relevant documentation on the behalf of Garret Reddish, MD,as directed by  Garret Reddish, MD while in the presence of Garret Reddish, MD.  I, Garret Reddish, MD, have reviewed all documentation for this visit. The documentation on 06/25/21 for the exam, diagnosis, procedures, and orders are all accurate and complete.   Return precautions advised.  Garret Reddish, MD

## 2021-06-27 LAB — HEPATITIS C RNA QUANTITATIVE
HCV Quantitative Log: <1.18 log IU/mL
HCV RNA, PCR, QN: <15 IU/mL

## 2021-07-09 DIAGNOSIS — F4310 Post-traumatic stress disorder, unspecified: Secondary | ICD-10-CM | POA: Diagnosis not present

## 2021-07-16 ENCOUNTER — Other Ambulatory Visit: Payer: Self-pay

## 2021-07-16 ENCOUNTER — Ambulatory Visit
Admission: RE | Admit: 2021-07-16 | Discharge: 2021-07-16 | Disposition: A | Discharge status: Home / Self Care | Payer: Self-pay | Source: Ambulatory Visit | Attending: Family Medicine | Admitting: Family Medicine

## 2021-07-16 DIAGNOSIS — E785 Hyperlipidemia, unspecified: Secondary | ICD-10-CM

## 2021-07-22 DIAGNOSIS — F4310 Post-traumatic stress disorder, unspecified: Secondary | ICD-10-CM | POA: Diagnosis not present

## 2021-08-04 DIAGNOSIS — M19011 Primary osteoarthritis, right shoulder: Secondary | ICD-10-CM | POA: Diagnosis not present

## 2021-08-13 DIAGNOSIS — F4310 Post-traumatic stress disorder, unspecified: Secondary | ICD-10-CM | POA: Diagnosis not present

## 2021-08-14 DIAGNOSIS — M1611 Unilateral primary osteoarthritis, right hip: Secondary | ICD-10-CM | POA: Diagnosis not present

## 2021-08-22 ENCOUNTER — Other Ambulatory Visit: Payer: Self-pay | Admitting: Family Medicine

## 2021-08-26 DIAGNOSIS — F4310 Post-traumatic stress disorder, unspecified: Secondary | ICD-10-CM | POA: Diagnosis not present

## 2021-09-10 DIAGNOSIS — F4310 Post-traumatic stress disorder, unspecified: Secondary | ICD-10-CM | POA: Diagnosis not present

## 2021-09-18 DIAGNOSIS — Z20822 Contact with and (suspected) exposure to covid-19: Secondary | ICD-10-CM | POA: Diagnosis not present

## 2021-09-24 DIAGNOSIS — F4310 Post-traumatic stress disorder, unspecified: Secondary | ICD-10-CM | POA: Diagnosis not present

## 2021-10-07 DIAGNOSIS — F4310 Post-traumatic stress disorder, unspecified: Secondary | ICD-10-CM | POA: Diagnosis not present

## 2021-10-17 IMAGING — CT CT CARDIAC CORONARY ARTERY CALCIUM SCORE
3 series · 14 of 20 positions shown, 16 images · non-contrast
Comparison: None.
COMPARISON: None.

Addendum:
EXAM:
OVER-READ INTERPRETATION  CT CHEST

The following report is an over-read performed by radiologist Dr.
over-read does not include interpretation of cardiac or coronary
anatomy or pathology. The coronary calcium score interpretation by
the cardiologist is attached.
CLINICAL DATA: Cardiovascular Disease Risk stratification
Coronary Calcium Score
TECHNIQUE: A gated, non-contrast computed tomography scan of the heart was
performed using 3mm slice thickness. Axial images were analyzed on a
dedicated workstation. Calcium scoring of the coronary arteries was
performed using the Agatston method.

[Series 2: cascseq 2.0 sa36 70% (id) · axial · 0.39mm/px · z∈[-242,-152]mm · 4 of 76 slices shown]
[im 16/76  vessel]
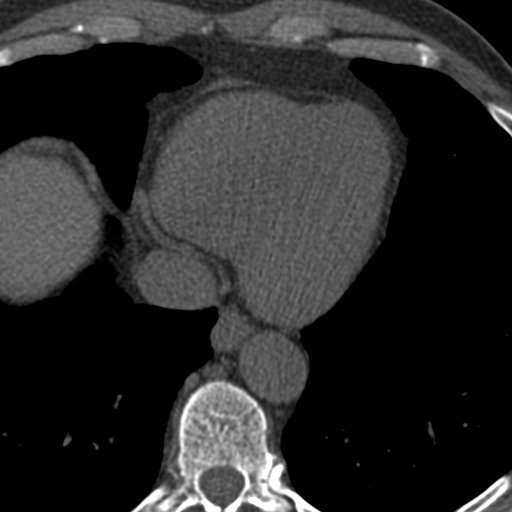
[im 31/76  vessel]
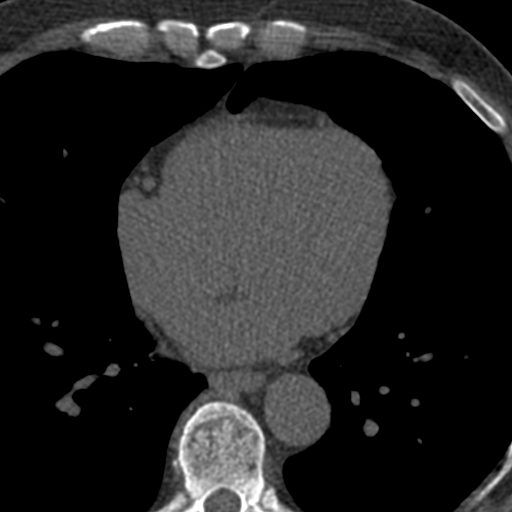
[im 46/76  vessel]
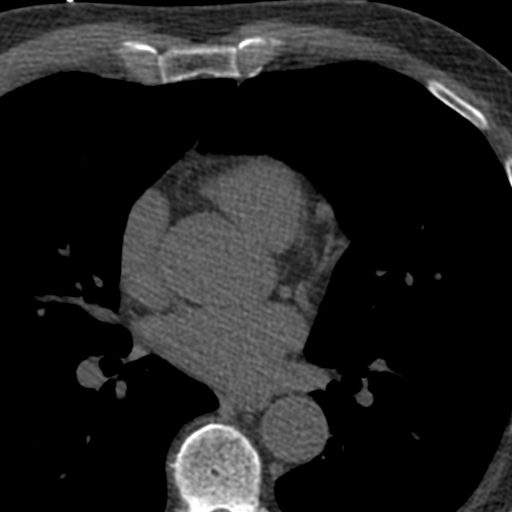
[im 61/76  vessel]
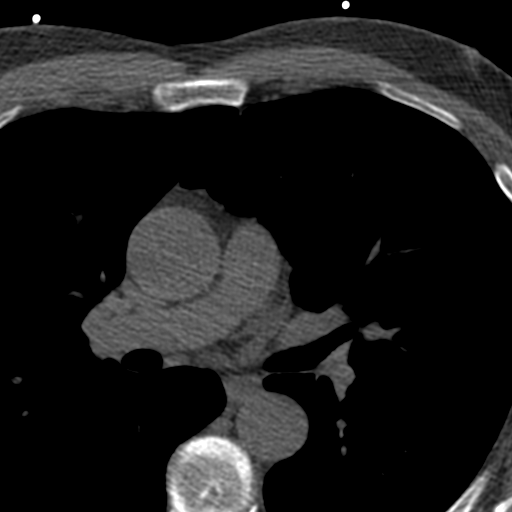

[Series 3: cascseq 2.0 bf37 st · axial · 0.68mm/px · z∈[-248,-148]mm · 5 of 76 slices shown, 7 images]
[im 13/76  vessel]
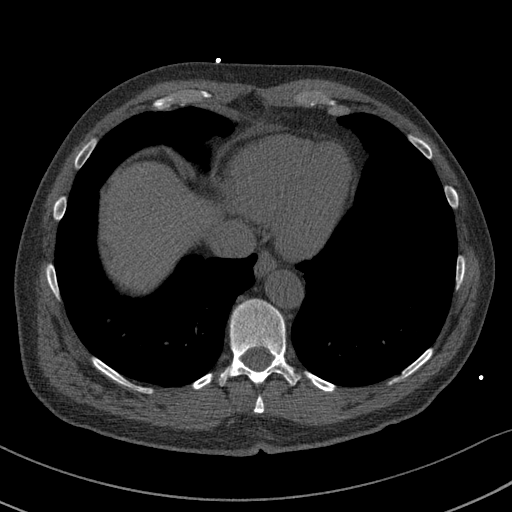
[im 13/76  lung]
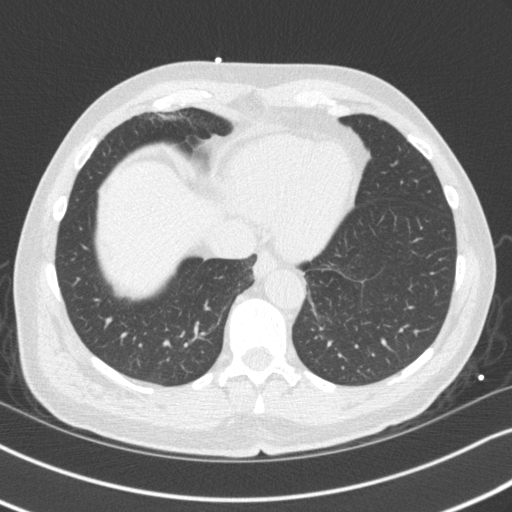
[im 26/76  vessel]
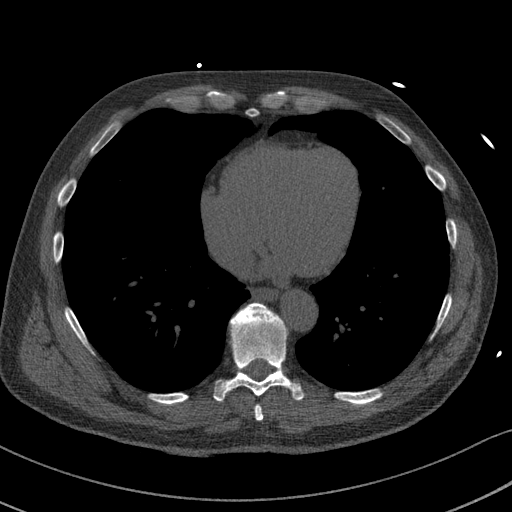
[im 38/76  vessel]
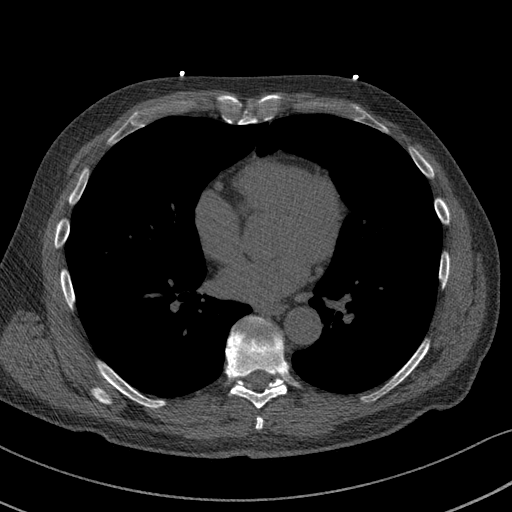
[im 51/76  vessel]
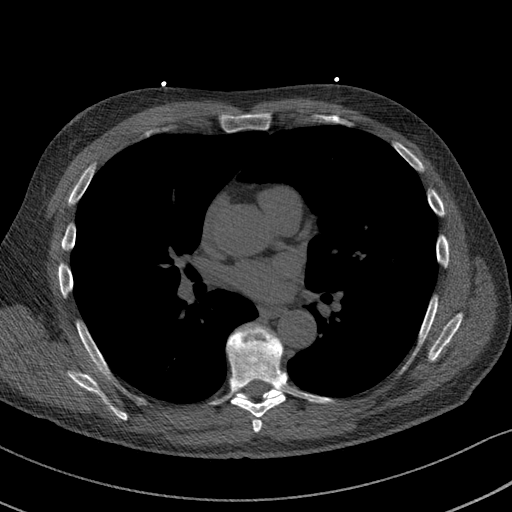
[im 63/76  vessel]
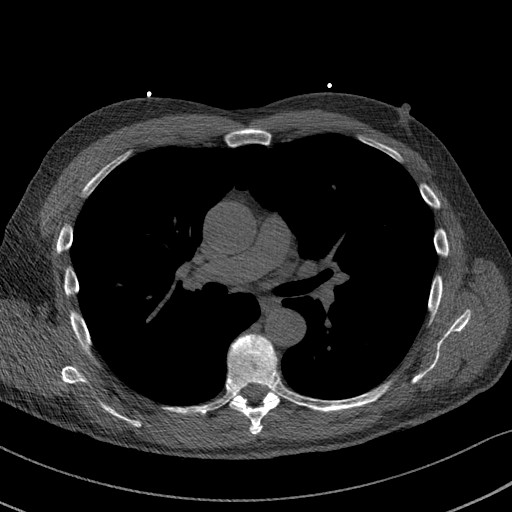
[im 63/76  lung]
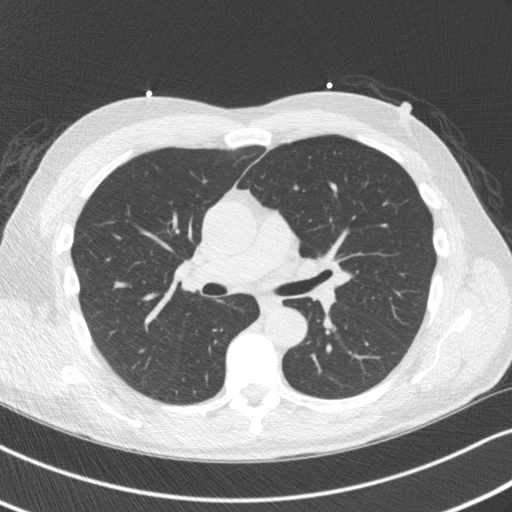

[Series 4: cascseq 2.0 br59 lung · axial · 0.68mm/px · z∈[-248,-148]mm · 5 of 76 slices shown]
[im 13/76  lung]
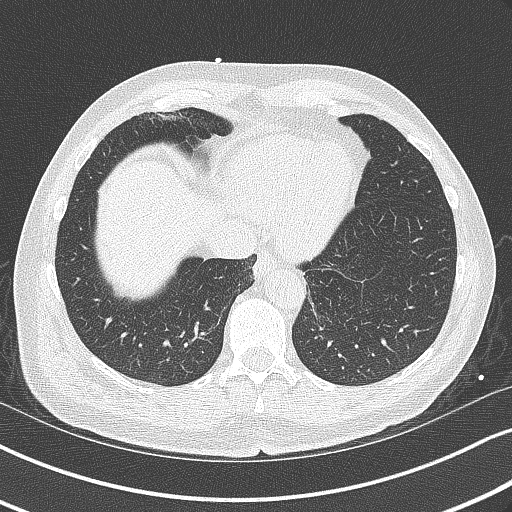
[im 26/76  lung]
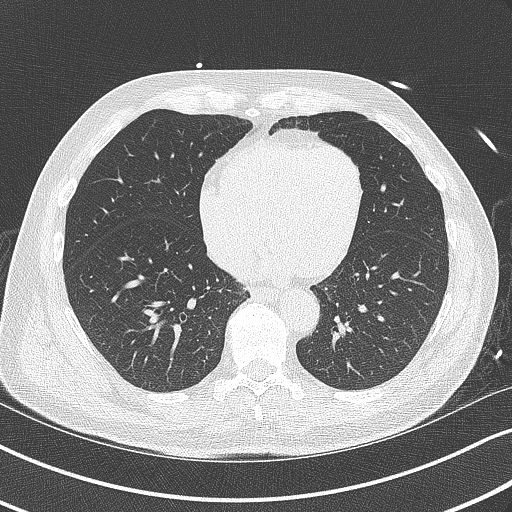
[im 38/76  lung]
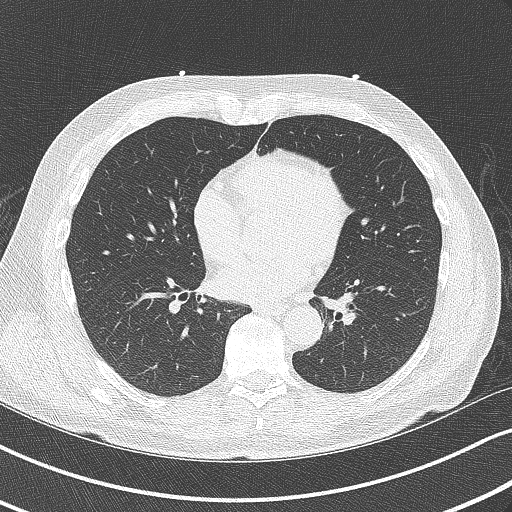
[im 51/76  lung]
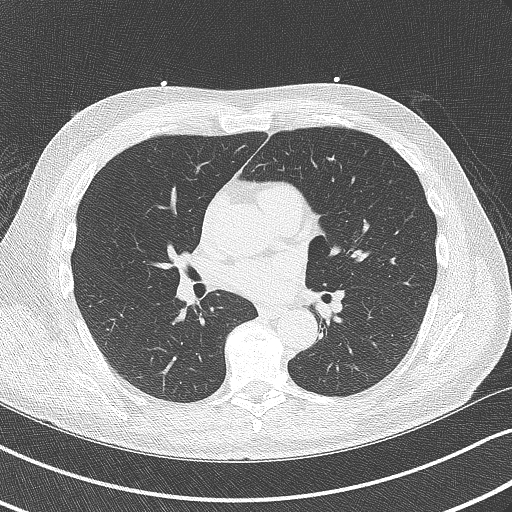
[im 63/76  lung]
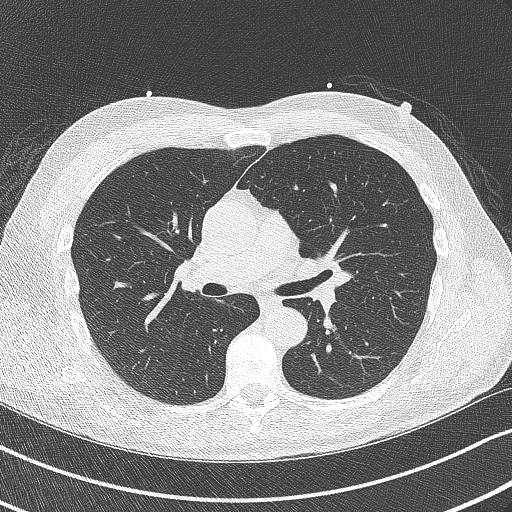

[14 of 20 positions shown; findings below may reference images not displayed]

FINDINGS: Vascular: No significant noncardiac vascular findings.

Mediastinum/Nodes: Visualized mediastinum and hilar regions
demonstrate no lymphadenopathy or masses.

Lungs/Pleura: Mild scarring at both lung bases. Visualized lungs
show no evidence of pulmonary edema, consolidation, pneumothorax,
nodule or pleural fluid.

Upper Abdomen: No acute abnormality.

Musculoskeletal: No chest wall mass or suspicious bone lesions
identified.
IMPRESSION: No significant incidental findings.
FINDINGS: Coronary arteries: Normal origins.

Coronary Calcium Score:

Left main: 0

Left anterior descending artery: 0

Left circumflex artery: 0

Right coronary artery: 0

Total: 0

Percentile: 0

Pericardium: Normal.

Ascending Aorta: Normal caliber. Ascending aorta is approximately 35
mm.

Non-cardiac: See separate report from [REDACTED].
IMPRESSION: Coronary calcium score of 0. This was 0 percentile for age-, race-,
and sex-matched controls.



If CAC=0, it is reasonable to withhold statin therapy and reassess
in 5 to 10 years, as long as higher risk conditions are absent
(diabetes mellitus, family history of premature CHD in first degree
relatives (males <55 years; females <65 years), cigarette smoking,
or LDL >=190 mg/dL).

If CAC is 1 to 99, it is reasonable to initiate statin therapy for
patients >=55 years of age.

If CAC is >=100 or >=75th percentile, it is reasonable to initiate
statin therapy at any age.

Cardiology referral should be considered for patients with CAC
scores >=400 or >=75th percentile.

*2004 AHA/ACC/AACVPR/AAPA/ABC/COTY/JIM/MICIELLE GRACE/Jumper/EZELL/MEDALIYACOUB/DIEUSEUL MARCELIN
Guideline on the Management of Blood Cholesterol: A Report of the
American College of Cardiology/American Heart Association Task Force
on Clinical Practice Guidelines. J Am Coll Cardiol.
4402;73(24):3280-3844.

*** End of Addendum ***
EXAM:
OVER-READ INTERPRETATION  CT CHEST

The following report is an over-read performed by radiologist Dr.
over-read does not include interpretation of cardiac or coronary
anatomy or pathology. The coronary calcium score interpretation by
the cardiologist is attached.
FINDINGS: Vascular: No significant noncardiac vascular findings.

Mediastinum/Nodes: Visualized mediastinum and hilar regions
demonstrate no lymphadenopathy or masses.

Lungs/Pleura: Mild scarring at both lung bases. Visualized lungs
show no evidence of pulmonary edema, consolidation, pneumothorax,
nodule or pleural fluid.

Upper Abdomen: No acute abnormality.

Musculoskeletal: No chest wall mass or suspicious bone lesions
identified.
IMPRESSION: No significant incidental findings.

## 2021-10-26 DIAGNOSIS — Z96611 Presence of right artificial shoulder joint: Secondary | ICD-10-CM | POA: Diagnosis not present

## 2021-10-26 DIAGNOSIS — Z471 Aftercare following joint replacement surgery: Secondary | ICD-10-CM | POA: Diagnosis not present

## 2021-10-28 DIAGNOSIS — F4310 Post-traumatic stress disorder, unspecified: Secondary | ICD-10-CM | POA: Diagnosis not present

## 2021-11-17 ENCOUNTER — Telehealth: Payer: Self-pay | Admitting: Family Medicine

## 2021-11-17 DIAGNOSIS — B182 Chronic viral hepatitis C: Secondary | ICD-10-CM

## 2021-11-17 DIAGNOSIS — E785 Hyperlipidemia, unspecified: Secondary | ICD-10-CM

## 2021-11-17 NOTE — Telephone Encounter (Signed)
Can do the cbc and cmp under hyperlipidemia  BUT I need ICD 10 code from [facility] that they want for PT/INR

## 2021-11-17 NOTE — Telephone Encounter (Signed)
See below, is this ok?

## 2021-11-17 NOTE — Telephone Encounter (Signed)
Pt called and said he is having joint replacement 11/26/21 with [facility] and they needed labs done prior, they need Prothrombin, INR, CBC, and CMP done, is this okay to schedule? Callback [phone]

## 2021-11-18 NOTE — Telephone Encounter (Signed)
I ordered the cbc and cmp.  I called and spoke with [facility] and the ICD10 they gave was 27130(right total hip replacement) , I was unable to find that to associate the pt/inr with.

## 2021-11-18 NOTE — Telephone Encounter (Signed)
Try his chronic hepatitis C diagnosis on problem list for this

## 2021-11-18 NOTE — Addendum Note (Signed)
Addended by: Clyde Lundborg A on: 11/18/2021 02:29 PM   Modules accepted: Orders

## 2021-11-19 ENCOUNTER — Other Ambulatory Visit (INDEPENDENT_AMBULATORY_CARE_PROVIDER_SITE_OTHER): Payer: BC Managed Care – PPO

## 2021-11-19 ENCOUNTER — Other Ambulatory Visit: Payer: Self-pay

## 2021-11-19 DIAGNOSIS — E785 Hyperlipidemia, unspecified: Secondary | ICD-10-CM | POA: Diagnosis not present

## 2021-11-19 DIAGNOSIS — F4310 Post-traumatic stress disorder, unspecified: Secondary | ICD-10-CM | POA: Diagnosis not present

## 2021-11-19 DIAGNOSIS — B182 Chronic viral hepatitis C: Secondary | ICD-10-CM

## 2021-11-19 LAB — CBC WITH DIFFERENTIAL/PLATELET
Basophils Absolute: 0 10*3/uL (ref 0.0–0.1)
Basophils Relative: 0.6 % (ref 0.0–3.0)
Eosinophils Absolute: 0.1 10*3/uL (ref 0.0–0.7)
Eosinophils Relative: 2.2 % (ref 0.0–5.0)
HCT: 49.6 % (ref 39.0–52.0)
Hemoglobin: 16.6 g/dL (ref 13.0–17.0)
Lymphocytes Relative: 36 % (ref 12.0–46.0)
Lymphs Abs: 2.3 10*3/uL (ref 0.7–4.0)
MCHC: 33.5 g/dL (ref 30.0–36.0)
MCV: 92.9 fl (ref 78.0–100.0)
Monocytes Absolute: 0.6 10*3/uL (ref 0.1–1.0)
Monocytes Relative: 8.9 % (ref 3.0–12.0)
Neutro Abs: 3.4 10*3/uL (ref 1.4–7.7)
Neutrophils Relative %: 52.3 % (ref 43.0–77.0)
Platelets: 219 10*3/uL (ref 150.0–400.0)
RBC: 5.34 Mil/uL (ref 4.22–5.81)
RDW: 13.5 % (ref 11.5–15.5)
WBC: 6.5 10*3/uL (ref 4.0–10.5)

## 2021-11-19 LAB — COMPREHENSIVE METABOLIC PANEL
ALT: 12 U/L (ref 0–53)
AST: 15 U/L (ref 0–37)
Albumin: 4.5 g/dL (ref 3.5–5.2)
Alkaline Phosphatase: 67 U/L (ref 39–117)
BUN: 20 mg/dL (ref 6–23)
CO2: 30 mEq/L (ref 19–32)
Calcium: 10.3 mg/dL (ref 8.4–10.5)
Chloride: 102 mEq/L (ref 96–112)
Creatinine, Ser: 1.1 mg/dL (ref 0.40–1.50)
GFR: 70.87 mL/min (ref >60.00)
Glucose, Bld: 87 mg/dL (ref 70–99)
Potassium: 4.2 mEq/L (ref 3.5–5.1)
Sodium: 138 mEq/L (ref 135–145)
Total Bilirubin: 0.9 mg/dL (ref 0.2–1.2)
Total Protein: 8 g/dL (ref 6.0–8.3)

## 2021-11-19 LAB — PROTIME-INR
INR: 1 ratio (ref 0.8–1.0)
Prothrombin Time: 11.3 s (ref 9.6–13.1)

## 2021-11-19 NOTE — Telephone Encounter (Signed)
Called and spoke with pt and labs scheduled for today.

## 2021-11-23 DIAGNOSIS — Z96611 Presence of right artificial shoulder joint: Secondary | ICD-10-CM | POA: Diagnosis not present

## 2021-11-23 DIAGNOSIS — M25511 Pain in right shoulder: Secondary | ICD-10-CM | POA: Diagnosis not present

## 2021-11-26 DIAGNOSIS — M1611 Unilateral primary osteoarthritis, right hip: Secondary | ICD-10-CM | POA: Diagnosis not present

## 2021-12-10 DIAGNOSIS — F4310 Post-traumatic stress disorder, unspecified: Secondary | ICD-10-CM | POA: Diagnosis not present

## 2021-12-30 DIAGNOSIS — F4310 Post-traumatic stress disorder, unspecified: Secondary | ICD-10-CM | POA: Diagnosis not present

## 2022-01-12 ENCOUNTER — Other Ambulatory Visit: Payer: Self-pay | Admitting: Family Medicine

## 2022-01-14 DIAGNOSIS — H2513 Age-related nuclear cataract, bilateral: Secondary | ICD-10-CM | POA: Diagnosis not present

## 2022-01-14 DIAGNOSIS — H524 Presbyopia: Secondary | ICD-10-CM | POA: Diagnosis not present

## 2022-01-21 DIAGNOSIS — F4310 Post-traumatic stress disorder, unspecified: Secondary | ICD-10-CM | POA: Diagnosis not present

## 2022-02-11 DIAGNOSIS — F4310 Post-traumatic stress disorder, unspecified: Secondary | ICD-10-CM | POA: Diagnosis not present

## 2022-03-11 DIAGNOSIS — F4310 Post-traumatic stress disorder, unspecified: Secondary | ICD-10-CM | POA: Diagnosis not present

## 2022-04-15 ENCOUNTER — Other Ambulatory Visit: Payer: Self-pay | Admitting: Family Medicine

## 2022-06-30 ENCOUNTER — Encounter: Payer: Self-pay | Admitting: Family Medicine

## 2022-06-30 ENCOUNTER — Ambulatory Visit (INDEPENDENT_AMBULATORY_CARE_PROVIDER_SITE_OTHER): Payer: Managed Care, Other (non HMO) | Admitting: Family Medicine

## 2022-06-30 VITALS — BP 110/64 | HR 86 | Temp 98.4°F | Ht 70.0 in | Wt 171.6 lb

## 2022-06-30 DIAGNOSIS — Z0000 Encounter for general adult medical examination without abnormal findings: Secondary | ICD-10-CM

## 2022-06-30 DIAGNOSIS — E785 Hyperlipidemia, unspecified: Secondary | ICD-10-CM | POA: Diagnosis not present

## 2022-06-30 DIAGNOSIS — Z23 Encounter for immunization: Secondary | ICD-10-CM | POA: Diagnosis not present

## 2022-06-30 DIAGNOSIS — N401 Enlarged prostate with lower urinary tract symptoms: Secondary | ICD-10-CM

## 2022-06-30 DIAGNOSIS — B182 Chronic viral hepatitis C: Secondary | ICD-10-CM

## 2022-06-30 DIAGNOSIS — R350 Frequency of micturition: Secondary | ICD-10-CM

## 2022-06-30 DIAGNOSIS — Z87891 Personal history of nicotine dependence: Secondary | ICD-10-CM

## 2022-06-30 DIAGNOSIS — Z125 Encounter for screening for malignant neoplasm of prostate: Secondary | ICD-10-CM | POA: Diagnosis not present

## 2022-06-30 DIAGNOSIS — F1021 Alcohol dependence, in remission: Secondary | ICD-10-CM | POA: Diagnosis not present

## 2022-06-30 DIAGNOSIS — F325 Major depressive disorder, single episode, in full remission: Secondary | ICD-10-CM | POA: Insufficient documentation

## 2022-06-30 LAB — CBC WITH DIFFERENTIAL/PLATELET
Basophils Absolute: 0 10*3/uL (ref 0.0–0.1)
Basophils Relative: 0.6 % (ref 0.0–3.0)
Eosinophils Absolute: 0.1 10*3/uL (ref 0.0–0.7)
Eosinophils Relative: 2.2 % (ref 0.0–5.0)
HCT: 47.3 % (ref 39.0–52.0)
Hemoglobin: 15.9 g/dL (ref 13.0–17.0)
Lymphocytes Relative: 29 % (ref 12.0–46.0)
Lymphs Abs: 1.6 10*3/uL (ref 0.7–4.0)
MCHC: 33.5 g/dL (ref 30.0–36.0)
MCV: 95.3 fl (ref 78.0–100.0)
Monocytes Absolute: 0.4 10*3/uL (ref 0.1–1.0)
Monocytes Relative: 8 % (ref 3.0–12.0)
Neutro Abs: 3.4 10*3/uL (ref 1.4–7.7)
Neutrophils Relative %: 60.2 % (ref 43.0–77.0)
Platelets: 232 10*3/uL (ref 150.0–400.0)
RBC: 4.97 Mil/uL (ref 4.22–5.81)
RDW: 13.4 % (ref 11.5–15.5)
WBC: 5.6 10*3/uL (ref 4.0–10.5)

## 2022-06-30 LAB — COMPREHENSIVE METABOLIC PANEL
ALT: 8 U/L (ref 0–53)
AST: 13 U/L (ref 0–37)
Albumin: 4.3 g/dL (ref 3.5–5.2)
Alkaline Phosphatase: 60 U/L (ref 39–117)
BUN: 21 mg/dL (ref 6–23)
CO2: 28 mEq/L (ref 19–32)
Calcium: 9.9 mg/dL (ref 8.4–10.5)
Chloride: 103 mEq/L (ref 96–112)
Creatinine, Ser: 1.11 mg/dL (ref 0.40–1.50)
GFR: 69.81 mL/min (ref >60.00)
Glucose, Bld: 70 mg/dL (ref 70–99)
Potassium: 4.7 mEq/L (ref 3.5–5.1)
Sodium: 140 mEq/L (ref 135–145)
Total Bilirubin: 0.8 mg/dL (ref 0.2–1.2)
Total Protein: 7.1 g/dL (ref 6.0–8.3)

## 2022-06-30 LAB — LIPID PANEL
Cholesterol: 180 mg/dL (ref 0–200)
HDL: 39.7 mg/dL (ref >39.00)
LDL Cholesterol: 117 mg/dL — ABNORMAL HIGH (ref 0–99)
NonHDL: 140.46
Total CHOL/HDL Ratio: 5
Triglycerides: 118 mg/dL (ref 0.0–149.0)
VLDL: 23.6 mg/dL (ref 0.0–40.0)

## 2022-06-30 LAB — PSA: PSA: 3.42 ng/mL (ref 0.10–4.00)

## 2022-06-30 NOTE — Progress Notes (Signed)
Phone: [phone]   Subjective:  Patient presents today for their annual physical. Chief complaint-noted.   See problem oriented charting- ROS- full  review of systems was completed and negative  except for: urinary frequency and urgency, sad mood  The following were reviewed and entered/updated in epic: Past Medical History:  Diagnosis Date   Anxiety    Cancer ([place])    basal cell removed from arm    Depression    Hepatitis C    Persistent headaches    Shoulder pain, right    torn rotator cuff- putting off   Substance abuse [facility])    Patient Active Problem List   Diagnosis Date Noted   Alcohol dependence in remission ([facility]) 06/25/2020    Priority: High   Chronic hepatitis C without hepatic coma ([place]) 09/06/2007    Priority: High   BPH (benign prostatic hyperplasia) 02/11/2020    Priority: Medium    Hyperlipidemia 06/20/2019    Priority: Medium    Migraine headache without aura 09/10/2013    Priority: Medium    Depression 10/08/2008    Priority: Medium    Lipoma of face 03/15/2019    Priority: Low   Onychomycosis 06/08/2018    Priority: Low   History of adenomatous polyp of colon 12/28/2016    Priority: Low   Erectile dysfunction 12/28/2016    Priority: Low   HPV in male 03/12/2010    Priority: Low   Pain in joint of right shoulder 11/07/2019   Past Surgical History:  Procedure Laterality Date   COLONOSCOPY     INGUINAL HERNIA REPAIR     right   POLYPECTOMY     STEM cell therapy shoulder     with sedation   WISDOM TOOTH EXTRACTION      Family History  Problem Relation Age of Onset   Renal cancer Mother        right nephrectomy. mets to lung- oral meds for that   Non-Hodgkin's lymphoma Mother        chemo   Hypertension Mother    Other Mother        TIA   Parkinson's disease Father        died age 42. complications from parkinsons. 20 years.    Arthritis Sister        hip replacement   Rheumatic fever Brother        cataracts at 81, not sure  about heart issues   Colon cancer Maternal Grandfather    Heart attack Maternal Grandfather        age 58 reported 02/11/20 visit   Pancreatic cancer Sister        doing well post surgery   Esophageal cancer Neg Hx    Liver cancer Neg Hx    Rectal cancer Neg Hx    Stomach cancer Neg Hx    Colon polyps Neg Hx     Medications- reviewed and updated Current Outpatient Medications  Medication Sig Dispense Refill   acetaminophen (TYLENOL) 325 MG tablet Take 325-650 mg by mouth every 6 (six) hours as needed for mild pain (or headaches).     citalopram (CELEXA) 10 MG tablet TAKE ONE TABLET BY MOUTH ONE TIME DAILY 90 tablet 3   ibuprofen (ADVIL) 200 MG tablet Take 200-400 mg by mouth every 6 (six) hours as needed for mild pain (or headaches).     Multiple Vitamin (MULTIVITAMIN) tablet Take 1 tablet by mouth daily.     Omega-3 1000 MG CAPS Take 1,000  mg by mouth daily.     tadalafil (CIALIS) 5 MG tablet TAKE ONE TABLET BY MOUTH ONE TIME DAILY for bph 100 tablet 0   No current facility-administered medications for this visit.    Allergies-reviewed and updated Allergies  Allergen Reactions   Poison Ivy Extract Itching and Rash    Social History   Social History Narrative   GF. 1 daughter age 67- in town. No grandkids.       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       Hobbies: rest, white water kayaker in past- shoulder prevents   Objective  Objective:  BP 110/64   Pulse 86   Temp 98.4 F (36.9 C)   Ht '5\' 10"'$  (1.778 m)   Wt 171 lb 9.6 oz (77.8 kg)   SpO2 97%   BMI 24.62 kg/m  Gen: NAD, resting comfortably HEENT: Mucous membranes are moist. Oropharynx normal. Right TM normal- left TM partially obscured by protrustion from back of ear greater than 50%- stable from 2021 exam Neck: no thyromegaly CV: RRR no murmurs rubs or gallops Lungs: CTAB no crackles, wheeze, rhonchi Abdomen:  soft/nontender/nondistended/normal bowel sounds. No rebound or guarding.  Ext: no edema Skin: warm, dry Neuro: grossly normal, moves all extremities, PERRLA    Assessment and Plan  66 y.o. male presenting for annual physical.  Health Maintenance counseling: 1. Anticipatory guidance: Patient counseled regarding regular dental exams - generally q6 months- needs to schedule, eye exams -yearly,  avoiding smoking and second hand smoke , limiting alcohol to 2 beverages per day - none- no drinks in 39 years, no illicit drugs.   2. Risk factor reduction:  Advised patient of need for regular exercise and diet rich and fruits and vegetables to reduce risk of heart attack and stroke.  Exercise- walking dog regularly and does resistance training twice a week.  Diet/weight management- weight stable within 4 lbs last year.  Wt Readings from Last 3 Encounters:  06/30/22 171 lb 9.6 oz (77.8 kg)  06/25/21 167 lb 9.6 oz (76 kg)  04/03/21 170 lb (77.1 kg)  3. Immunizations/screenings/ancillary studies- fall flu shot recommended- consider covid 19 vaccine, shingrix today #1, tdap later date Immunization History  Administered Date(s) Administered   Hepatitis A, Adult 04/13/2017, 09/26/2017   Hepatitis B, adult 04/13/2017, 05/23/2017, 09/26/2017   Influenza Whole 08/15/2009, 08/16/2019   Influenza-Unspecified 08/18/2016, 08/15/2020   Moderna Sars-Covid-2 Vaccination 11/20/2019, 12/17/2019, 11/23/2020   Pneumococcal Polysaccharide-23 03/09/2017   Td 11/15/2001   Tdap 05/30/2012  4. Prostate cancer screening-  low risk prior psa trend- trend with labs today- does have BPH- had rechecked in 2021 with bump to 3.2 but trended back down . Defers rectal for now Lab Results  Component Value Date   PSA 2.74 06/25/2021   PSA 2.48 12/08/2020   PSA 3.2 06/26/2020   5. Colon cancer screening - There is a history of adenomatous polyp of the colon with Dr. Ardis Hughs 02/09/2021 and 5-year repeat planned 6. Skin cancer  screening- about 4 years ago. advised regular sunscreen use. Denies worrisome, changing, or new skin lesions.  7. Smoking associated screening (lung cancer screening, AAA screen 65-75, UA)- former smoker- quit 1983. 03/16/2017 US abdomen- Abdominal aorta: No aneurysm visualized. 8. STD screening - only active with GF but broke up recently- no unprotected sex since that time- opts out std screening  Status of chronic or acute concerns   #hyperlipidemia S: Medication:none other than  omega 3  Coronary artery calcium scoring 0 in 2022 Lab Results  Component Value Date   CHOL 184 06/25/2021   HDL 43.90 06/25/2021   LDLCALC 127 (H) 06/25/2021   LDLDIRECT 141.1 10/01/2008   TRIG 66.0 06/25/2021   CHOLHDL 4 06/25/2021   A/P: with reassuring ct cardiac scoring- update lipids- mainly fous on lifestyle and consider repeat 2027 or later on ct cardiac scoring  # Depression S: Medication:citalopram 10 mg  -situational worsening -broke up with GF of 2 years (after she relapsed and lied to him) and she had suicide attempt in early august - in behavioral health    06/30/2022    8:09 AM 06/25/2021    8:17 AM 12/08/2020    3:44 PM  Depression screen PHQ 2/9  Decreased Interest 3 0 0  Down, Depressed, Hopeless 3 0 0  PHQ - 2 Score 6 0 0  Altered sleeping 0 0 0  Tired, decreased energy 0 1 0  Change in appetite 0 0 0  Feeling bad or failure about yourself  2 0 0  Trouble concentrating 0 0 0  Moving slowly or fidgety/restless 0 0 0  Suicidal thoughts 0 0 0  PHQ-9 Score 8 1 0  Difficult doing work/chores Somewhat difficult Not difficult at all Not difficult at all  A/P: would still consider depression in full remission. situational worsening/adjustment disorder- wants to hold steady on medication. In long run when things stabilize may be interested in SNRI- potentially cymbalta  #ED/BPH- cialis 5 mg daily helpful. Still with dribbling -still doing well from Ed perspective but BPH control is certainly  imperfect  # alcohol dependence in remission- nearly 40 years sober! Congratulated patient  #Shoulder surgery on right with Dr. Onnie Graham last September  #Hip replacement in January- Dr. Alvan Dame- hard but recovering well  #History of treated HCV- will update HCV RNA  Recommended follow up: Return in about 1 year (around 07/01/2023) for physical or sooner if needed.Schedule b4 you leave.  Lab/Order associations:NOT fasting   ICD-10-CM   1. Preventative health care  Z00.00     2. Chronic hepatitis C without hepatic coma (HCC)  B18.2     3. Hyperlipidemia, unspecified hyperlipidemia type  E78.5     4. Alcohol dependence in remission (HCC) Chronic F10.21     5. Screening for prostate cancer  Z12.5     6. Former smoker  Z87.891     64. Benign prostatic hyperplasia with urinary frequency  N40.1    R35.0      No orders of the defined types were placed in this encounter.  Return precautions advised.  Garret Reddish, MD

## 2022-06-30 NOTE — Patient Instructions (Addendum)
Shingrix #1 today. Repeat injection in 2-6 months. Schedule a nurse visit for the 2nd injection before you leave today (at the check out desk)  Tdap now due if you get a cut/scrape- we can update next visit or you can call for nurse visit  Flu shot- we should have these available within a month or two but please let us know if you get at outside pharmacy  Please stop by lab before you go If you have mychart- we will send your results within 3 business days of [place] receiving them.  If you do not have mychart- we will call you about results within 5 business days of [place] receiving them.  *please also note that you will see labs on mychart as soon as they post. I will later go in and write notes on them- will say "notes from Dr. Yong Channel"   Recommended follow up: Return in about 1 year (around 07/01/2023) for physical or sooner if needed.Schedule b4 you leave.

## 2022-06-30 NOTE — Addendum Note (Signed)
Addended by: Clyde Lundborg A on: 06/30/2022 08:40 AM   Modules accepted: Orders

## 2022-07-01 ENCOUNTER — Other Ambulatory Visit: Payer: Self-pay

## 2022-07-01 ENCOUNTER — Encounter: Payer: Self-pay | Admitting: Family Medicine

## 2022-07-01 DIAGNOSIS — R972 Elevated prostate specific antigen [PSA]: Secondary | ICD-10-CM

## 2022-07-01 LAB — HCV RNA QUANT

## 2022-07-01 NOTE — Progress Notes (Signed)
Patient states he will go to [facility] lab in 1 to 2 months to complete lab.

## 2022-07-02 ENCOUNTER — Other Ambulatory Visit: Payer: Self-pay

## 2022-07-02 DIAGNOSIS — B182 Chronic viral hepatitis C: Secondary | ICD-10-CM

## 2022-07-02 DIAGNOSIS — Z0000 Encounter for general adult medical examination without abnormal findings: Secondary | ICD-10-CM

## 2022-07-05 NOTE — Telephone Encounter (Signed)
HCV was cancelled with lab.  Patient states he was informed to have PSA completed in 2 months.  Patient would like to know if PSA and HCV can wait to be completed for 3 months.  States he would like to have those test completed at his next Shingrix vac.    Please follow up in regard.

## 2022-07-06 NOTE — Telephone Encounter (Signed)
Ok to schedule this same day as shingrix is scheduled.

## 2022-07-06 NOTE — Telephone Encounter (Signed)
See below

## 2022-07-27 ENCOUNTER — Other Ambulatory Visit: Payer: Self-pay | Admitting: Family Medicine

## 2022-08-09 ENCOUNTER — Encounter: Payer: Self-pay | Admitting: *Deleted

## 2022-08-27 ENCOUNTER — Encounter: Payer: Self-pay | Admitting: Family Medicine

## 2022-08-29 ENCOUNTER — Other Ambulatory Visit: Payer: Self-pay | Admitting: Family Medicine

## 2022-09-15 ENCOUNTER — Telehealth: Payer: Self-pay | Admitting: *Deleted

## 2022-09-15 NOTE — Patient Outreach (Signed)
  Care Coordination   09/15/2022 Name: Christopher Leonard MRN: [id number] DOB: 1956/08/29   Care Coordination Outreach Attempts:  An unsuccessful telephone outreach was attempted today to offer the patient information about available care coordination services as a benefit of their health plan.   Follow Up Plan:  Additional outreach attempts will be made to offer the patient care coordination information and services.   Encounter Outcome:  No Answer  Care Coordination Interventions Activated:  No   Care Coordination Interventions:  No, not indicated    Raina Mina, RN Care Management Coordinator [place] Office [phone]

## 2022-11-20 ENCOUNTER — Other Ambulatory Visit: Payer: Self-pay | Admitting: Family Medicine

## 2022-12-02 ENCOUNTER — Telehealth: Payer: Self-pay | Admitting: Family Medicine

## 2022-12-02 NOTE — Telephone Encounter (Signed)
Error

## 2022-12-03 ENCOUNTER — Other Ambulatory Visit: Payer: Self-pay | Admitting: Family Medicine

## 2022-12-23 ENCOUNTER — Encounter: Payer: Self-pay | Admitting: Family Medicine

## 2022-12-23 ENCOUNTER — Ambulatory Visit: Payer: Managed Care, Other (non HMO) | Admitting: Family Medicine

## 2022-12-23 ENCOUNTER — Ambulatory Visit: Payer: Managed Care, Other (non HMO)

## 2022-12-23 VITALS — BP 132/80 | HR 71 | Temp 97.8°F | Ht 70.0 in | Wt 173.4 lb

## 2022-12-23 DIAGNOSIS — B182 Chronic viral hepatitis C: Secondary | ICD-10-CM

## 2022-12-23 DIAGNOSIS — K4090 Unilateral inguinal hernia, without obstruction or gangrene, not specified as recurrent: Secondary | ICD-10-CM

## 2022-12-23 DIAGNOSIS — R972 Elevated prostate specific antigen [PSA]: Secondary | ICD-10-CM | POA: Diagnosis not present

## 2022-12-23 DIAGNOSIS — E785 Hyperlipidemia, unspecified: Secondary | ICD-10-CM

## 2022-12-23 DIAGNOSIS — Z0000 Encounter for general adult medical examination without abnormal findings: Secondary | ICD-10-CM

## 2022-12-23 DIAGNOSIS — Z23 Encounter for immunization: Secondary | ICD-10-CM

## 2022-12-23 NOTE — Patient Instructions (Addendum)
Tdap and final shingrix today  Had covid in fall- so we will hold off on repeat covid vaccine at this time  Recommended follow up: Return for as needed for new, worsening, persistent symptoms.

## 2022-12-23 NOTE — Progress Notes (Signed)
Phone [phone] In person visit   Subjective:   Christopher Leonard is a 67 y.o. year old very pleasant male patient who presents for/with See problem oriented charting Chief Complaint  Patient presents with   Hernia    Pt is here for general surg referral for left inguinal hernia repair   Other    )NOT MASKED)Pt is wanting to have PSA drawn and any other labs that are needed, as well as SHINGRIX and TDAP.    Past Medical History-  Patient Active Problem List   Diagnosis Date Noted   Alcohol dependence in remission ([place]) 06/25/2020    Priority: High   Chronic hepatitis C without hepatic coma ([place]) 09/06/2007    Priority: High   BPH (benign prostatic hyperplasia) 02/11/2020    Priority: Medium    Hyperlipidemia 06/20/2019    Priority: Medium    Migraine headache without aura 09/10/2013    Priority: Medium    Depression 10/08/2008    Priority: Medium    Lipoma of face 03/15/2019    Priority: Low   Onychomycosis 06/08/2018    Priority: Low   History of adenomatous polyp of colon 12/28/2016    Priority: Low   Erectile dysfunction 12/28/2016    Priority: Low   HPV in male 03/12/2010    Priority: Low   Major depressive disorder with single episode, in full remission ([place]) 06/30/2022   Pain in joint of right shoulder 11/07/2019    Medications- reviewed and updated Current Outpatient Medications  Medication Sig Dispense Refill   acetaminophen (TYLENOL) 325 MG tablet Take 325-650 mg by mouth every 6 (six) hours as needed for mild pain (or headaches).     citalopram (CELEXA) 10 MG tablet TAKE ONE TABLET BY MOUTH ONE TIME DAILY 90 tablet 0   ibuprofen (ADVIL) 200 MG tablet Take 200-400 mg by mouth every 6 (six) hours as needed for mild pain (or headaches).     Multiple Vitamin (MULTIVITAMIN) tablet Take 1 tablet by mouth daily.     Omega-3 1000 MG CAPS Take 1,000 mg by mouth daily.     tadalafil (CIALIS) 5 MG tablet TAKE ONE TABLET BY MOUTH ONE TIME DAILY FOR BPH 100 tablet 0    No current facility-administered medications for this visit.     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 Mild discomfort and notes bulge. No issues with bowel movements.  Can easily reduce  -4 mets without chest pain or shortness of breath easily.  A/P: Left inguinal hernia reported-will refer to sinus [place] surgery.  He is in good health with updated labs in August (outside of PSA which we want to repeat and history of hepatitis C which has not been detected but we are checking on labs)  #hyperlipidemia S: Medication: None Lab Results  Component Value Date   CHOL 180 06/30/2022   HDL 39.70 06/30/2022  LDLCALC 117 (H) 06/30/2022   LDLDIRECT 141.1 10/01/2008   TRIG 118.0 06/30/2022   CHOLHDL 5 06/30/2022   A/P: Very mild elevations but coronary artery calcium scoring of 0 in 2022-hold off on medication at this time   Recommended follow up: Return for as needed for new, worsening, persistent symptoms.  Lab/Order associations:   ICD-10-CM   1. Left inguinal hernia  K40.90 Ambulatory referral to General Surgery    2. Chronic hepatitis C without hepatic coma (HCC)  B18.2 HCV RNA quant    3. Hyperlipidemia, unspecified hyperlipidemia type  E78.5     4. Preventative health care  Z00.00 HCV RNA quant    5. Elevated PSA  R97.20 PSA     Return precautions advised.  Garret Reddish, MD

## 2022-12-24 LAB — PSA: PSA: 3.99 ng/mL (ref 0.10–4.00)

## 2022-12-25 LAB — HCV RNA QUANT: Hepatitis C Quantitation: NOT DETECTED IU/mL

## 2022-12-27 ENCOUNTER — Other Ambulatory Visit: Payer: Self-pay

## 2022-12-27 DIAGNOSIS — R972 Elevated prostate specific antigen [PSA]: Secondary | ICD-10-CM

## 2022-12-30 ENCOUNTER — Telehealth: Payer: Self-pay

## 2022-12-30 NOTE — Patient Outreach (Signed)
  Care Coordination   Initial Visit Note   12/30/2022 Name: Christopher Leonard MRN: [id number] DOB: 01/02/1956  Christopher Leonard is a 67 y.o. year old male who sees Yong Channel, Brayton Mars, MD for primary care. I spoke with  Patrick Jupiter by phone today.  What matters to the patients health and wellness today?  none     Goals Addressed             This Visit's Progress    COMPLETED: Care Coordination Activities-No folow up required       Interventions Today    Flowsheet Row Most Recent Value  General Interventions   General Interventions Discussed/Reviewed General Interventions Discussed, Doctor Visits, Health Screening  Doctor Visits Discussed/Reviewed Doctor Visits Discussed, Annual Wellness Visits  Health Screening Colonoscopy, Prostate  Education Interventions   Education Provided Provided Education  Provided Verbal Education On Other  [Preventive health]              SDOH assessments and interventions completed:  Yes  SDOH Interventions Today    Flowsheet Row Most Recent Value  SDOH Interventions   Food Insecurity Interventions Intervention Not Indicated  Housing Interventions Intervention Not Indicated  Depression Interventions/Treatment  Counseling        Care Coordination Interventions:  Yes, provided   Follow up plan: No further intervention required.   Encounter Outcome:  Pt. Visit Completed .Jone Baseman, RN, MSN [facility] Care Management Coordinator Direct Line [phone]

## 2022-12-30 NOTE — Patient Instructions (Signed)
Visit Information  Thank you for taking time to visit with me today. Please don't hesitate to contact me if I can be of assistance to you.   Following are the goals we discussed today:   Goals Addressed             This Visit's Progress    COMPLETED: Care Coordination Activities-No folow up required       Interventions Today    Flowsheet Row Most Recent Value  General Interventions   General Interventions Discussed/Reviewed General Interventions Discussed, Doctor Visits, Health Screening  Doctor Visits Discussed/Reviewed Doctor Visits Discussed, Annual Wellness Visits  Health Screening Colonoscopy, Prostate  Education Interventions   Education Provided Provided Education  Provided Verbal Education On Other  [Preventive health]              If you are experiencing a Mental Health or [place] or need someone to talk to, please call the Suicide and Crisis Lifeline: 988   Patient verbalizes understanding of instructions and care plan provided today and agrees to view in [place]. Active MyChart status and patient understanding of how to access instructions and care plan via MyChart confirmed with patient.     No further follow up required: decline  Jone Baseman, RN, MSN [facility] Care Management Coordinator Direct Line [phone]

## 2023-01-11 ENCOUNTER — Encounter: Payer: Self-pay | Admitting: Family Medicine

## 2023-02-03 ENCOUNTER — Other Ambulatory Visit: Payer: Self-pay | Admitting: Surgery

## 2023-02-04 ENCOUNTER — Other Ambulatory Visit (HOSPITAL_COMMUNITY): Payer: Self-pay | Admitting: Urology

## 2023-02-04 DIAGNOSIS — R972 Elevated prostate specific antigen [PSA]: Secondary | ICD-10-CM

## 2023-02-23 ENCOUNTER — Other Ambulatory Visit: Payer: Self-pay | Admitting: Family Medicine

## 2023-03-10 ENCOUNTER — Other Ambulatory Visit: Payer: Self-pay | Admitting: Family Medicine

## 2023-03-31 ENCOUNTER — Ambulatory Visit (HOSPITAL_COMMUNITY)
Admission: RE | Admit: 2023-03-31 | Discharge: 2023-03-31 | Disposition: A | Discharge status: Home / Self Care | Payer: Managed Care, Other (non HMO) | Source: Ambulatory Visit | Attending: Urology | Admitting: Urology

## 2023-03-31 ENCOUNTER — Encounter (HOSPITAL_COMMUNITY): Payer: Self-pay

## 2023-03-31 DIAGNOSIS — R972 Elevated prostate specific antigen [PSA]: Secondary | ICD-10-CM | POA: Insufficient documentation

## 2023-03-31 MED ORDER — GADOBUTROL 1 MMOL/ML IV SOLN
7.8000 mL | Freq: Once | INTRAVENOUS | Status: AC | PRN
  Administered 2023-03-31: 7.8 mL via INTRAVENOUS

## 2023-04-06 ENCOUNTER — Other Ambulatory Visit: Payer: Self-pay

## 2023-04-06 ENCOUNTER — Encounter (HOSPITAL_BASED_OUTPATIENT_CLINIC_OR_DEPARTMENT_OTHER): Payer: Self-pay | Admitting: Surgery

## 2023-04-08 ENCOUNTER — Encounter (HOSPITAL_BASED_OUTPATIENT_CLINIC_OR_DEPARTMENT_OTHER)
Admission: RE | Admit: 2023-04-08 | Discharge: 2023-04-08 | Disposition: A | Discharge status: Home / Self Care | Payer: Managed Care, Other (non HMO) | Source: Ambulatory Visit | Attending: Surgery

## 2023-04-08 DIAGNOSIS — Z01812 Encounter for preprocedural laboratory examination: Secondary | ICD-10-CM | POA: Insufficient documentation

## 2023-04-08 DIAGNOSIS — B182 Chronic viral hepatitis C: Secondary | ICD-10-CM | POA: Insufficient documentation

## 2023-04-08 LAB — COMPREHENSIVE METABOLIC PANEL
ALT: 12 U/L (ref 0–44)
AST: 15 U/L (ref 15–41)
Albumin: 3.6 g/dL (ref 3.5–5.0)
Alkaline Phosphatase: 57 U/L (ref 38–126)
Anion gap: 7 (ref 5–15)
BUN: 18 mg/dL (ref 8–23)
CO2: 26 mmol/L (ref 22–32)
Calcium: 9.4 mg/dL (ref 8.9–10.3)
Chloride: 106 mmol/L (ref 98–111)
Creatinine, Ser: 1.08 mg/dL (ref 0.61–1.24)
GFR, Estimated: >60 mL/min (ref >60)
Glucose, Bld: 80 mg/dL (ref 70–99)
Potassium: 4.9 mmol/L (ref 3.5–5.1)
Sodium: 139 mmol/L (ref 135–145)
Total Bilirubin: 0.9 mg/dL (ref 0.3–1.2)
Total Protein: 6.7 g/dL (ref 6.5–8.1)

## 2023-04-08 MED ORDER — CHLORHEXIDINE GLUCONATE CLOTH 2 % EX PADS: 6.0000 | MEDICATED_PAD | Freq: Once | CUTANEOUS | Status: DC

## 2023-04-08 MED ORDER — ENSURE PRE-SURGERY PO LIQD: 296.0000 mL | Freq: Once | ORAL | Status: DC

## 2023-04-08 NOTE — Progress Notes (Signed)

## 2023-04-13 NOTE — H&P (Signed)
REFERRING PHYSICIAN: Shelva Majestic, MD PROVIDER: Wayne Both, MD MRN: [id number] DOB: 12/04/1955  Subjective  Chief Complaint: New Consultation and Hernia  History of Present Illness: Christopher Leonard is a 67 y.o. male who is seen  as an office consultation for evaluation of New Consultation and Hernia  This is a pleasant 67 year old gentleman who is here for evaluation of a left inguinal hernia. He reports having had a bulge for several months. He has had a previous right inguinal hernia repaired over 30 years ago. He reports minimal discomfort and no obstructive symptoms and reports that easily reduces. He has no cardiopulmonary issues and has had no problems with prior surgery other than some slight urinary retention after both a shoulder replacement and hip replacement but did not require catheter  Review of Systems: A complete review of systems was obtained from the patient. I have reviewed this information and discussed as appropriate with the patient. See HPI as well for other ROS.  ROS  Medical History: Past Medical History: Diagnosis Date Anxiety Chronic hepatitis C (CMS-HCC) 07/15/2014  Patient Active Problem List Diagnosis Chronic hepatitis C (CMS-HCC)  Past Surgical History: Procedure Laterality Date HERNIA REPAIR JOINT REPLACEMENT   No Known Allergies  Current Outpatient Medications on File Prior to Visit Medication Sig Dispense Refill citalopram (CELEXA) 10 MG tablet Take 10 mg by mouth once daily. DOCOSAHEXAENOIC ACID ORAL Take by mouth multivitamin tablet Take 1 tablet by mouth once daily. rizatriptan (MAXALT) 5 MG tablet Take 5 mg by mouth as needed for Migraine. May take a second dose after 2 hours if needed.  No current facility-administered medications on file prior to visit.  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  Physical Exam  He appears well on exam  His abdomen is soft and nontender.  He has a well-healed incision in his right inguinal area without evidence of recurrence  There is an easily reducible left inguinal hernia  There are no umbilical hernias  Labs, Imaging and Diagnostic Testing: I have reviewed his notes in the electronic medical records  Assessment and Plan:  Diagnoses and all orders for this visit:  Left inguinal hernia   We discussed abdominal wall anatomy and hernias. He is well aware of hernias given his previous repair. We next discussed both the open and laparoscopic techniques regarding hernia repair. After discussion regarding the pros and cons of each he has decided to proceed with an open repair with mesh with a tap block. I explained the surgical procedure in detail. We discussed the risks which includes but is not limited to bleeding, infection, hernia recurrence, use of mesh, nerve entrapment, chronic pain, cardiopulmonary issues with anesthesia, postoperative recovery, etc. He understands and wishes to proceed with surgery  which will be scheduled at his convenience

## 2023-04-14 ENCOUNTER — Encounter (HOSPITAL_BASED_OUTPATIENT_CLINIC_OR_DEPARTMENT_OTHER)
Admission: RE | Disposition: A | Discharge status: Home / Self Care | Payer: Self-pay | Source: Home / Self Care | Attending: Surgery

## 2023-04-14 ENCOUNTER — Ambulatory Visit (HOSPITAL_BASED_OUTPATIENT_CLINIC_OR_DEPARTMENT_OTHER): Payer: Managed Care, Other (non HMO) | Admitting: Anesthesiology

## 2023-04-14 ENCOUNTER — Encounter (HOSPITAL_BASED_OUTPATIENT_CLINIC_OR_DEPARTMENT_OTHER): Payer: Self-pay | Admitting: Surgery

## 2023-04-14 ENCOUNTER — Ambulatory Visit (HOSPITAL_BASED_OUTPATIENT_CLINIC_OR_DEPARTMENT_OTHER)
Admission: RE | Admit: 2023-04-14 | Discharge: 2023-04-14 | Disposition: A | Discharge status: Home / Self Care | Payer: Managed Care, Other (non HMO) | Attending: Surgery | Admitting: Surgery

## 2023-04-14 ENCOUNTER — Other Ambulatory Visit: Payer: Self-pay

## 2023-04-14 DIAGNOSIS — Z87891 Personal history of nicotine dependence: Secondary | ICD-10-CM | POA: Diagnosis not present

## 2023-04-14 DIAGNOSIS — B182 Chronic viral hepatitis C: Secondary | ICD-10-CM | POA: Diagnosis not present

## 2023-04-14 DIAGNOSIS — G43909 Migraine, unspecified, not intractable, without status migrainosus: Secondary | ICD-10-CM | POA: Diagnosis not present

## 2023-04-14 DIAGNOSIS — K759 Inflammatory liver disease, unspecified: Secondary | ICD-10-CM | POA: Diagnosis not present

## 2023-04-14 DIAGNOSIS — F418 Other specified anxiety disorders: Secondary | ICD-10-CM | POA: Diagnosis not present

## 2023-04-14 DIAGNOSIS — Z01818 Encounter for other preprocedural examination: Secondary | ICD-10-CM

## 2023-04-14 DIAGNOSIS — K4090 Unilateral inguinal hernia, without obstruction or gangrene, not specified as recurrent: Secondary | ICD-10-CM | POA: Diagnosis present

## 2023-04-14 HISTORY — PX: INSERTION OF MESH: SHX5868

## 2023-04-14 HISTORY — PX: INGUINAL HERNIA REPAIR: SHX194

## 2023-04-14 SURGERY — REPAIR, HERNIA, INGUINAL, ADULT
Anesthesia: Regional | Site: Groin | Laterality: Left

## 2023-04-14 MED ORDER — ONDANSETRON HCL 4 MG/2ML IJ SOLN: 4.0000 mg | Freq: Once | INTRAMUSCULAR | Status: DC | PRN

## 2023-04-14 MED ORDER — MIDAZOLAM HCL 2 MG/2ML IJ SOLN
2.0000 mg | Freq: Once | INTRAMUSCULAR | Status: AC
  Administered 2023-04-14: 2 mg via INTRAVENOUS

## 2023-04-14 MED ORDER — ONDANSETRON HCL 4 MG/2ML IJ SOLN
INTRAMUSCULAR | Status: DC | PRN
  Administered 2023-04-14: 4 mg via INTRAVENOUS

## 2023-04-14 MED ORDER — DEXAMETHASONE SODIUM PHOSPHATE 10 MG/ML IJ SOLN
INTRAMUSCULAR | Status: AC
  Filled 2023-04-14: qty 1

## 2023-04-14 MED ORDER — PROPOFOL 10 MG/ML IV BOLUS
INTRAVENOUS | Status: DC | PRN
  Administered 2023-04-14: 170 mg via INTRAVENOUS

## 2023-04-14 MED ORDER — FENTANYL CITRATE (PF) 100 MCG/2ML IJ SOLN
INTRAMUSCULAR | Status: AC
  Filled 2023-04-14: qty 2

## 2023-04-14 MED ORDER — EPHEDRINE SULFATE (PRESSORS) 50 MG/ML IJ SOLN
INTRAMUSCULAR | Status: DC | PRN
  Administered 2023-04-14: 5 mg via INTRAVENOUS

## 2023-04-14 MED ORDER — EPHEDRINE SULFATE (PRESSORS) 50 MG/ML IJ SOLN
INTRAMUSCULAR | Status: DC | PRN
  Administered 2023-04-14: 7 mg via INTRAMUSCULAR
  Administered 2023-04-14: 5 mg via INTRAMUSCULAR

## 2023-04-14 MED ORDER — OXYCODONE HCL 5 MG PO TABS
ORAL_TABLET | ORAL | Status: AC
  Filled 2023-04-14: qty 1

## 2023-04-14 MED ORDER — ACETAMINOPHEN 500 MG PO TABS
1000.0000 mg | ORAL_TABLET | ORAL | Status: AC
  Administered 2023-04-14: 1000 mg via ORAL

## 2023-04-14 MED ORDER — MIDAZOLAM HCL 2 MG/2ML IJ SOLN
INTRAMUSCULAR | Status: AC
  Filled 2023-04-14: qty 2

## 2023-04-14 MED ORDER — BUPIVACAINE-EPINEPHRINE (PF) 0.5% -1:200000 IJ SOLN
INTRAMUSCULAR | Status: AC
  Filled 2023-04-14: qty 150

## 2023-04-14 MED ORDER — ACETAMINOPHEN 500 MG PO TABS
ORAL_TABLET | ORAL | Status: AC
  Filled 2023-04-14: qty 2

## 2023-04-14 MED ORDER — FENTANYL CITRATE (PF) 100 MCG/2ML IJ SOLN
25.0000 ug | INTRAMUSCULAR | Status: DC | PRN
  Administered 2023-04-14: 50 ug via INTRAVENOUS

## 2023-04-14 MED ORDER — OXYCODONE HCL 5 MG PO TABS
5.0000 mg | ORAL_TABLET | Freq: Once | ORAL | Status: AC
  Administered 2023-04-14: 5 mg via ORAL

## 2023-04-14 MED ORDER — LIDOCAINE HCL (CARDIAC) PF 100 MG/5ML IV SOSY
PREFILLED_SYRINGE | INTRAVENOUS | Status: DC | PRN
  Administered 2023-04-14: 50 mg via INTRAVENOUS

## 2023-04-14 MED ORDER — ONDANSETRON HCL 4 MG/2ML IJ SOLN
INTRAMUSCULAR | Status: AC
  Filled 2023-04-14: qty 2

## 2023-04-14 MED ORDER — FENTANYL CITRATE (PF) 100 MCG/2ML IJ SOLN: 100.0000 ug | Freq: Once | INTRAMUSCULAR | Status: DC

## 2023-04-14 MED ORDER — CEFAZOLIN SODIUM-DEXTROSE 2-4 GM/100ML-% IV SOLN
2.0000 g | INTRAVENOUS | Status: AC
  Administered 2023-04-14: 2 g via INTRAVENOUS

## 2023-04-14 MED ORDER — BUPIVACAINE-EPINEPHRINE 0.5% -1:200000 IJ SOLN
INTRAMUSCULAR | Status: DC | PRN
  Administered 2023-04-14: 10 mL

## 2023-04-14 MED ORDER — LIDOCAINE 2% (20 MG/ML) 5 ML SYRINGE
INTRAMUSCULAR | Status: AC
  Filled 2023-04-14: qty 5

## 2023-04-14 MED ORDER — GLYCOPYRROLATE PF 0.2 MG/ML IJ SOSY
PREFILLED_SYRINGE | INTRAMUSCULAR | Status: AC
  Filled 2023-04-14: qty 1

## 2023-04-14 MED ORDER — ROPIVACAINE HCL 5 MG/ML IJ SOLN
INTRAMUSCULAR | Status: DC | PRN
  Administered 2023-04-14: 30 mL via PERINEURAL

## 2023-04-14 MED ORDER — AMISULPRIDE (ANTIEMETIC) 5 MG/2ML IV SOLN: 10.0000 mg | Freq: Once | INTRAVENOUS | Status: DC | PRN

## 2023-04-14 MED ORDER — DEXAMETHASONE SODIUM PHOSPHATE 10 MG/ML IJ SOLN
INTRAMUSCULAR | Status: DC | PRN
  Administered 2023-04-14: 4 mg via INTRAVENOUS

## 2023-04-14 MED ORDER — CEFAZOLIN SODIUM-DEXTROSE 2-4 GM/100ML-% IV SOLN
INTRAVENOUS | Status: AC
  Filled 2023-04-14: qty 100

## 2023-04-14 MED ORDER — KETOROLAC TROMETHAMINE 15 MG/ML IJ SOLN
15.0000 mg | Freq: Once | INTRAMUSCULAR | Status: AC | PRN
  Administered 2023-04-14: 15 mg via INTRAVENOUS

## 2023-04-14 MED ORDER — KETOROLAC TROMETHAMINE 15 MG/ML IJ SOLN
INTRAMUSCULAR | Status: AC
  Filled 2023-04-14: qty 1

## 2023-04-14 MED ORDER — LACTATED RINGERS IV SOLN: INTRAVENOUS | Status: DC

## 2023-04-14 MED ORDER — GLYCOPYRROLATE 0.2 MG/ML IJ SOLN
INTRAMUSCULAR | Status: DC | PRN
  Administered 2023-04-14: .2 mg via INTRAVENOUS

## 2023-04-14 MED ORDER — EPHEDRINE 5 MG/ML INJ
INTRAVENOUS | Status: AC
  Filled 2023-04-14: qty 5

## 2023-04-14 MED ORDER — FENTANYL CITRATE (PF) 100 MCG/2ML IJ SOLN
INTRAMUSCULAR | Status: DC | PRN
  Administered 2023-04-14: 50 ug via INTRAVENOUS

## 2023-04-14 SURGICAL SUPPLY — 45 items
ADH SKN CLS APL DERMABOND .7 (GAUZE/BANDAGES/DRESSINGS) ×1
APL PRP STRL LF DISP 70% ISPRP (MISCELLANEOUS) ×1
BLADE CLIPPER SURG (BLADE) ×1 IMPLANT
BLADE SURG 15 STRL LF DISP TIS (BLADE) ×1 IMPLANT
BLADE SURG 15 STRL SS (BLADE) ×1
CANISTER SUCT 1200ML W/VALVE (MISCELLANEOUS) IMPLANT
CHLORAPREP W/TINT 26 (MISCELLANEOUS) ×1 IMPLANT
COVER BACK TABLE 60X90IN (DRAPES) ×1 IMPLANT
COVER MAYO STAND STRL (DRAPES) ×1 IMPLANT
DERMABOND ADVANCED .7 DNX12 (GAUZE/BANDAGES/DRESSINGS) ×1 IMPLANT
DRAIN PENROSE .5X12 LATEX STL (DRAIN) ×1 IMPLANT
DRAPE LAPAROTOMY 100X72 PEDS (DRAPES) ×1 IMPLANT
DRAPE UTILITY XL STRL (DRAPES) ×1 IMPLANT
ELECT REM PT RETURN 9FT ADLT (ELECTROSURGICAL) ×1
ELECTRODE REM PT RTRN 9FT ADLT (ELECTROSURGICAL) ×1 IMPLANT
GLOVE BIO SURGEON STRL SZ7 (GLOVE) IMPLANT
GLOVE BIO SURGEON STRL SZ7.5 (GLOVE) IMPLANT
GLOVE BIOGEL PI IND STRL 7.0 (GLOVE) IMPLANT
GLOVE BIOGEL PI IND STRL 7.5 (GLOVE) IMPLANT
GLOVE SURG SIGNA 7.5 PF LTX (GLOVE) ×1 IMPLANT
GOWN STRL REUS W/ TWL LRG LVL3 (GOWN DISPOSABLE) ×1 IMPLANT
GOWN STRL REUS W/ TWL XL LVL3 (GOWN DISPOSABLE) ×1 IMPLANT
GOWN STRL REUS W/TWL LRG LVL3 (GOWN DISPOSABLE) ×3
GOWN STRL REUS W/TWL XL LVL3 (GOWN DISPOSABLE) ×1
MESH PARIETEX PROGRIP LEFT (Mesh General) IMPLANT
NDL HYPO 25X1 1.5 SAFETY (NEEDLE) ×1 IMPLANT
NEEDLE HYPO 25X1 1.5 SAFETY (NEEDLE) ×1 IMPLANT
NS IRRIG 1000ML POUR BTL (IV SOLUTION) IMPLANT
PACK BASIN DAY SURGERY FS (CUSTOM PROCEDURE TRAY) ×1 IMPLANT
PENCIL SMOKE EVACUATOR (MISCELLANEOUS) ×1 IMPLANT
SLEEVE SCD COMPRESS KNEE MED (STOCKING) ×1 IMPLANT
SPIKE FLUID TRANSFER (MISCELLANEOUS) IMPLANT
SPONGE INTESTINAL PEANUT (DISPOSABLE) IMPLANT
SPONGE T-LAP 4X18 ~~LOC~~+RFID (SPONGE) ×1 IMPLANT
SUT MNCRL AB 4-0 PS2 18 (SUTURE) ×1 IMPLANT
SUT SILK 2 0 SH (SUTURE) IMPLANT
SUT VIC AB 2-0 CT1 27 (SUTURE) ×2
SUT VIC AB 2-0 CT1 TAPERPNT 27 (SUTURE) ×2 IMPLANT
SUT VIC AB 3-0 CT1 27 (SUTURE) ×1
SUT VIC AB 3-0 CT1 27XBRD (SUTURE) ×1 IMPLANT
SYR BULB EAR ULCER 3OZ GRN STR (SYRINGE) IMPLANT
SYR CONTROL 10ML LL (SYRINGE) ×1 IMPLANT
TOWEL GREEN STERILE FF (TOWEL DISPOSABLE) ×1 IMPLANT
TUBE CONNECTING 20X1/4 (TUBING) IMPLANT
YANKAUER SUCT BULB TIP NO VENT (SUCTIONS) IMPLANT

## 2023-04-14 NOTE — Anesthesia Preprocedure Evaluation (Addendum)
Anesthesia Evaluation  Patient identified by MRN, date of birth, ID band Patient awake    Reviewed: Allergy & Precautions, NPO status , Patient's Chart, lab work & pertinent test results  Airway Mallampati: II  TM Distance: >3 FB Neck ROM: Full    Dental no notable dental hx.    Pulmonary former smoker   Pulmonary exam normal        Cardiovascular negative cardio ROS Normal cardiovascular exam     Neuro/Psych  Headaches PSYCHIATRIC DISORDERS Anxiety Depression       GI/Hepatic negative GI ROS,,,(+)     substance abuse  , Hepatitis -  Endo/Other  negative endocrine ROS    Renal/GU negative Renal ROS     Musculoskeletal negative musculoskeletal ROS (+)    Abdominal   Peds  Hematology negative hematology ROS (+)   Anesthesia Other Findings  LEFT INGUINAL HERNIA  Reproductive/Obstetrics                             Anesthesia Physical Anesthesia Plan  ASA: 2  Anesthesia Plan: General and Regional   Post-op Pain Management: Regional block*   Induction: Intravenous  PONV Risk Score and Plan: 2 and Ondansetron, Dexamethasone, Midazolam and Treatment may vary due to age or medical condition  Airway Management Planned: LMA  Additional Equipment:   Intra-op Plan:   Post-operative Plan: Extubation in OR  Informed Consent: I have reviewed the patients History and Physical, chart, labs and discussed the procedure including the risks, benefits and alternatives for the proposed anesthesia with the patient or authorized representative who has indicated his/her understanding and acceptance.     Dental advisory given  Plan Discussed with: CRNA  Anesthesia Plan Comments:        Anesthesia Quick Evaluation

## 2023-04-14 NOTE — Interval H&P Note (Signed)
History and Physical Interval Note:no change in H and P  04/14/2023 7:00 AM  Christopher Leonard  has presented today for surgery, with the diagnosis of LEFT INGUINAL HERNIA.  The various methods of treatment have been discussed with the patient and family. After consideration of risks, benefits and other options for treatment, the patient has consented to  Procedure(s): OPEN LEFT INGUINAL HERNIA REPAIR WITH MESH (Left) as a surgical intervention.  The patient's history has been reviewed, patient examined, no change in status, stable for surgery.  I have reviewed the patient's chart and labs.  Questions were answered to the patient's satisfaction.     Abigail Miyamoto

## 2023-04-14 NOTE — Op Note (Signed)
OPEN LEFT INGUINAL HERNIA REPAIR WITH MESH, INSERTION OF MESH  Procedure Note  Christopher Leonard 04/14/2023   Pre-op Diagnosis: LEFT INGUINAL HERNIA     Post-op Diagnosis: same  Procedure(s): OPEN LEFT INGUINAL HERNIA REPAIR WITH MESH INSERTION OF MESH  Surgeon(s): Abigail Miyamoto, MD  Assist: Bernarda Caffey, MD [facility] Resident  Anesthesia: General  Staff:  Circulator: Maryan Rued, RN Scrub Person: Burroughs, Larry Sierras, RN  Estimated Blood Loss: Minimal               Findings: The patient was found to have a small direct left inguinal hernia which was repaired with a large piece of Prolene ProGrip mesh from [employer]  Procedure: The patient was brought to the operating room and identified as a correct patient.  He was placed upon the operating room table and general anesthesia was induced.  His abdomen was prepped and draped in the usual sterile fashion.  The skin in the left inguinal area was anesthetized with Marcaine and a longitudinal incision was made with a scalpel.  The dissection was then carried down through Scarpa's fascia with the electrocautery.  The external oblique fascia was then identified and opened toward the internal and external ring.  The testicular cord and structures were then identified and controlled with a Penrose drain.  The patient was found to have a direct hernia defect which was easily reduced.  The cord structures were evaluated and there was no evidence of indirect hernia.  Next a large piece of Prolene ProGrip mesh from [employer] was brought to the field.  It was placed against the pubic tubercle and then brought around the cord structures covering the internal ring.  The mesh was sutured in place with a single 2-0 Vicryl suture to the pubic tubercle.  Again wide coverage of the inguinal floor was achieved as well as the internal ring.  We then closed the external bleak fascia over the top of the mesh with a running 2-0 Vicryl suture.  Scarpa's fascia was closed  interrupted 3-0 Vicryl sutures and the skin was closed with a running 4-0 Monocryl.  Dermabond was then applied.  The patient tolerated the procedure well.  All the counts were correct at the end of the procedure.  The patient was then extubated in the operating room and taken in a stable condition to the recovery room.          Abigail Miyamoto   Date: 04/14/2023  Time: 8:04 AM

## 2023-04-14 NOTE — Discharge Instructions (Addendum)
[facility] _______[facility]  UMBILICAL OR INGUINAL HERNIA REPAIR: POST OP INSTRUCTIONS  Always review your discharge instruction sheet given to you by the facility where your surgery was performed. IF YOU HAVE DISABILITY OR FAMILY LEAVE FORMS, YOU MUST BRING THEM TO THE OFFICE FOR PROCESSING.   DO NOT GIVE THEM TO YOUR DOCTOR.  1. A  prescription for pain medication may be given to you upon discharge.  Take your pain medication as prescribed, if needed.  If narcotic pain medicine is not needed, then you may take acetaminophen (Tylenol) or ibuprofen (Advil) as needed. 2. Take your usually prescribed medications unless otherwise directed. If you need a refill on your pain medication, please contact your pharmacy.  They will contact our office to request authorization. Prescriptions will not be filled after 5 pm or on week-ends. 3. You should follow a light diet the first 24 hours after arrival home, such as soup and crackers, etc.  Be sure to include lots of fluids daily.  Resume your normal diet the day after surgery. 4.Most patients will experience some swelling and bruising around the umbilicus or in the groin and scrotum.  Ice packs and reclining will help.  Swelling and bruising can take several days to resolve.  6. It is common to experience some constipation if taking pain medication after surgery.  Increasing fluid intake and taking a stool softener (such as Colace) will usually help or prevent this problem from occurring.  A mild laxative (Milk of Magnesia or Miralax) should be taken according to package directions if there are no bowel movements after 48 hours. 7. Unless discharge instructions indicate otherwise, you may remove your bandages 24-48 hours after surgery, and you may shower at that time.  You may have steri-strips (small skin tapes) in place directly over the incision.  These strips should be left on the skin for 7-10 days.  If your surgeon used skin glue on the  incision, you may shower in 24 hours.  The glue will flake off over the next 2-3 weeks.  Any sutures or staples will be removed at the office during your follow-up visit. 8. ACTIVITIES:  You may resume regular (light) daily activities beginning the next day--such as daily self-care, walking, climbing stairs--gradually increasing activities as tolerated.  You may have sexual intercourse when it is comfortable.  Refrain from any heavy lifting or straining until approved by your doctor.  a.You may drive when you are no longer taking prescription pain medication, you can comfortably wear a seatbelt, and you can safely maneuver your car and apply brakes. b.RETURN TO WORK:   _____________________________________________  9.You should see your doctor in the office for a follow-up appointment approximately 2-3 weeks after your surgery.  Make sure that you call for this appointment within a day or two after you arrive home to insure a convenient appointment time. 10.OTHER INSTRUCTIONS: __YOU MAY SHOWER STARTING TOMORROW ICE PACK, TYLENOL, AND IBUPROFEN ALSO FOR PAIN NO LIFTING MORE THAN 15 TO 20 POUNDS FOR 4 WEEKS_______________________    _____________________________________  WHEN TO CALL YOUR DOCTOR: Fever over 101.0 Inability to urinate Nausea and/or vomiting Extreme swelling or bruising Continued bleeding from incision. Increased pain, redness, or drainage from the incision  The clinic staff is available to answer your questions during regular business hours.  Please don't hesitate to call and ask to speak to one of the nurses for clinical concerns.  If you have a medical emergency, go to the nearest emergency room or call  911.  A surgeon from [facility] is always on call at the hospital   [address], [address], [place], [place]  [address] ?  [address] [address], [place], [place]   [address] [phone] ? [phone] ? FAX [phone] Web site:  [email or web address]  May take Tylenol after 12:30 pm, if needed. May take NSAIDS (ibuprofen/motrin) after 2:45pm, if needed.    Post Anesthesia Home Care Instructions  Activity: Get plenty of rest for the remainder of the day. A responsible individual must stay with you for 24 hours following the procedure.  For the next 24 hours, DO NOT: -Drive a car -Advertising copywriter -Drink alcoholic beverages -Take any medication unless instructed by your physician -Make any legal decisions or sign important papers.  Meals: Start with liquid foods such as gelatin or soup. Progress to regular foods as tolerated. Avoid greasy, spicy, heavy foods. If nausea and/or vomiting occur, drink only clear liquids until the nausea and/or vomiting subsides. Call your physician if vomiting continues.  Special Instructions/Symptoms: Your throat may feel dry or sore from the anesthesia or the breathing tube placed in your throat during surgery. If this causes discomfort, gargle with warm salt water. The discomfort should disappear within 24 hours.  If you had a scopolamine patch placed behind your ear for the management of post- operative nausea and/or vomiting:  1. The medication in the patch is effective for 72 hours, after which it should be removed.  Wrap patch in a tissue and discard in the trash. Wash hands thoroughly with soap and water. 2. You may remove the patch earlier than 72 hours if you experience unpleasant side effects which may include dry mouth, dizziness or visual disturbances. 3. Avoid touching the patch. Wash your hands with soap and water after contact with the patch.    *you had 5mg  of oxycodone at 0915 today.

## 2023-04-14 NOTE — Progress Notes (Signed)
Assisted Dr. Ellender with left, transabdominal plane, ultrasound guided block. Side rails up, monitors on throughout procedure. See vital signs in flow sheet. Tolerated Procedure well. ? ?

## 2023-04-14 NOTE — Anesthesia Postprocedure Evaluation (Signed)
Anesthesia Post Note  Patient: Christopher Leonard  Procedure(s) Performed: OPEN LEFT INGUINAL HERNIA REPAIR WITH MESH (Left: Groin) INSERTION OF MESH (Left: Groin)     Patient location during evaluation: PACU Anesthesia Type: Regional and General Level of consciousness: awake Pain management: pain level controlled Vital Signs Assessment: post-procedure vital signs reviewed and stable Respiratory status: spontaneous breathing, nonlabored ventilation and respiratory function stable Cardiovascular status: blood pressure returned to baseline and stable Postop Assessment: no apparent nausea or vomiting Anesthetic complications: no   No notable events documented.  Last Vitals:  Vitals:   04/14/23 0900 04/14/23 0928  BP: 122/85 127/87  Pulse: 70 69  Resp: 12 16  Temp:  36.4 C  SpO2: 97% 97%    Last Pain:  Vitals:   04/14/23 0915  TempSrc:   PainSc: 4                  Juan Olthoff P Bitha Fauteux

## 2023-04-14 NOTE — Transfer of Care (Signed)
Immediate Anesthesia Transfer of Care Note  Patient: Christopher Leonard  Procedure(s) Performed: OPEN LEFT INGUINAL HERNIA REPAIR WITH MESH (Left: Groin) INSERTION OF MESH (Left: Groin)  Patient Location: PACU  Anesthesia Type:General  Level of Consciousness: awake, alert , oriented, and patient cooperative  Airway & Oxygen Therapy: Patient Spontanous Breathing and Patient connected to face mask oxygen  Post-op Assessment: Report given to RN and Post -op Vital signs reviewed and stable  Post vital signs: Reviewed and stable  Last Vitals:  Vitals Value Taken Time  BP 124/74 04/14/23 0818  Temp    Pulse 71 04/14/23 0818  Resp 14 04/14/23 0818  SpO2 99 % 04/14/23 0818  Vitals shown include unvalidated device data.  Last Pain:  Vitals:   04/14/23 0630  TempSrc: Oral  PainSc: 0-No pain      Patients Stated Pain Goal: 6 (04/14/23 0630)  Complications: No notable events documented.

## 2023-04-14 NOTE — Anesthesia Procedure Notes (Signed)
Anesthesia Regional Block: TAP block   Pre-Anesthetic Checklist: , timeout performed,  Correct Patient, Correct Site, Correct Laterality,  Correct Procedure, Correct Position, site marked,  Risks and benefits discussed,  Surgical consent,  Pre-op evaluation,  At surgeon's request and post-op pain management  Laterality: Left  Prep: chloraprep       Needles:  Injection technique: Single-shot  Needle Type: Echogenic Stimulator Needle     Needle Length: 10cm  Needle Gauge: 20     Additional Needles:   Procedures:,,,, ultrasound used (permanent image in chart),,    Narrative:  Start time: 04/14/2023 7:00 AM End time: 04/14/2023 7:10 AM Injection made incrementally with aspirations every 5 mL.  Performed by: Personally  Anesthesiologist: Leonides Grills, MD  Additional Notes: Functioning IV was confirmed and monitors were applied.  A timeout was performed. Sterile prep, hand hygiene and sterile gloves were used. A 20ga Bbraun echogenic stimulator needle was used. Negative aspiration and negative test dose prior to incremental administration of local anesthetic. The patient tolerated the procedure well.  Ultrasound guidance: relevent anatomy identified, needle position confirmed, local anesthetic spread visualized around nerve(s), vascular puncture avoided.  Image printed for medical record.

## 2023-04-14 NOTE — Anesthesia Procedure Notes (Signed)
Procedure Name: LMA Insertion Date/Time: 04/14/2023 7:35 AM  Performed by: Garth Bigness, CRNAPre-anesthesia Checklist: Patient identified, Emergency Drugs available, Suction available and Patient being monitored Patient Re-evaluated:Patient Re-evaluated prior to induction Oxygen Delivery Method: Circle system utilized Preoxygenation: Pre-oxygenation with 100% oxygen Induction Type: IV induction Ventilation: Mask ventilation without difficulty LMA: LMA inserted LMA Size: 4.0 Tube type: Oral Number of attempts: 1 Placement Confirmation: positive ETCO2 and breath sounds checked- equal and bilateral Tube secured with: Tape Dental Injury: Teeth and Oropharynx as per pre-operative assessment

## 2023-04-15 ENCOUNTER — Other Ambulatory Visit: Payer: Self-pay

## 2023-04-15 ENCOUNTER — Encounter (HOSPITAL_BASED_OUTPATIENT_CLINIC_OR_DEPARTMENT_OTHER): Payer: Self-pay | Admitting: Surgery

## 2023-05-25 ENCOUNTER — Other Ambulatory Visit: Payer: Self-pay | Admitting: Family Medicine

## 2023-06-20 ENCOUNTER — Other Ambulatory Visit: Payer: Self-pay | Admitting: Family Medicine

## 2023-08-23 ENCOUNTER — Other Ambulatory Visit: Payer: Self-pay | Admitting: Family Medicine

## 2023-09-30 ENCOUNTER — Other Ambulatory Visit: Payer: Self-pay | Admitting: Family Medicine

## 2023-11-18 ENCOUNTER — Other Ambulatory Visit: Payer: Self-pay | Admitting: Surgery

## 2023-12-20 ENCOUNTER — Other Ambulatory Visit: Payer: Self-pay | Admitting: Family Medicine

## 2024-01-18 ENCOUNTER — Other Ambulatory Visit: Payer: Self-pay | Admitting: Family Medicine

## 2024-02-17 ENCOUNTER — Other Ambulatory Visit: Payer: Self-pay | Admitting: Family Medicine

## 2024-02-22 ENCOUNTER — Telehealth: Admitting: Physician Assistant

## 2024-02-22 DIAGNOSIS — H109 Unspecified conjunctivitis: Secondary | ICD-10-CM

## 2024-02-22 MED ORDER — OFLOXACIN 0.3 % OP SOLN
1.0000 [drp] | Freq: Four times a day (QID) | OPHTHALMIC | 0 refills | Status: AC
Start: 2024-02-22 — End: 2024-02-27

## 2024-02-22 NOTE — Progress Notes (Signed)
 Virtual Visit Consent   [name], you are scheduled for a virtual visit with a [facility] provider today. Just as with appointments in the office, your consent must be obtained to participate. Your consent will be active for this visit and any virtual visit you may have with one of our providers in the next 365 days. If you have a MyChart account, a copy of this consent can be sent to you electronically.  As this is a virtual visit, video technology does not allow for your provider to perform a traditional examination. This may limit your provider's ability to fully assess your condition. If your provider identifies any concerns that need to be evaluated in person or the need to arrange testing (such as labs, EKG, etc.), we will make arrangements to do so. Although advances in technology are sophisticated, we cannot ensure that it will always work on either your end or our end. If the connection with a video visit is poor, the visit may have to be switched to a telephone visit. With either a video or telephone visit, we are not always able to ensure that we have a secure connection.  By engaging in this virtual visit, you consent to the provision of healthcare and authorize for your insurance to be billed (if applicable) for the services provided during this visit. Depending on your insurance coverage, you may receive a charge related to this service.  I need to obtain your verbal consent now. Are you willing to proceed with your visit today? Christopher Leonard has provided verbal consent on 02/22/2024 for a virtual visit (video or telephone). Margaretann Loveless, PA-C  Date: 02/22/2024 10:17 AM   Virtual Visit via Video Note   I, Margaretann Loveless, connected with  Christopher Leonard  ([id number], 08/18/1956) on 02/22/24 at 10:15 AM EDT by a video-enabled telemedicine application and verified that I am speaking with the correct person using two identifiers.  Location: Patient: Virtual Visit Location Patient:  Home Provider: Virtual Visit Location Provider: Home Office   I discussed the limitations of evaluation and management by telemedicine and the availability of in person appointments. The patient expressed understanding and agreed to proceed.    History of Present Illness: Christopher Leonard is a 68 y.o. who identifies as a male who was assigned male at birth, and is being seen today for possible pink eye.  HPI: Conjunctivitis  The current episode started yesterday. The onset was sudden. The problem occurs continuously. The problem has been unchanged. The problem is mild. Nothing relieves the symptoms. Nothing aggravates the symptoms. Associated symptoms include headaches (last night), eye discharge (crusted this morning), eye pain and eye redness. Pertinent negatives include no fever, no decreased vision, no double vision, no eye itching, no photophobia, no congestion, no rhinorrhea, no sore throat and no URI. The eye pain is mild. The left eye is affected. The eye pain is not associated with movement. The eyelid exhibits no abnormality.      Problems:  Patient Active Problem List   Diagnosis Date Noted   Major depressive disorder with single episode, in full remission (HCC) 06/30/2022   Alcohol dependence in remission (HCC) 06/25/2020   BPH (benign prostatic hyperplasia) 02/11/2020   Pain in joint of right shoulder 11/07/2019   Hyperlipidemia 06/20/2019   Lipoma of face 03/15/2019   Onychomycosis 06/08/2018   History of adenomatous polyp of colon 12/28/2016   Erectile dysfunction 12/28/2016   Migraine headache without aura 09/10/2013  HPV in male 03/12/2010   Depression 10/08/2008   Chronic hepatitis C without hepatic coma (HCC) 09/06/2007    Allergies:  Allergies  Allergen Reactions   Poison Ivy Extract Itching and Rash   Medications:  Current Outpatient Medications:    ofloxacin (OCUFLOX) 0.3 % ophthalmic solution, Place 1 drop into the left eye 4 (four) times daily for 5 days.,  Disp: 5 mL, Rfl: 0   acetaminophen (TYLENOL) 325 MG tablet, Take 325-650 mg by mouth every 6 (six) hours as needed for mild pain (or headaches)., Disp: , Rfl:    citalopram (CELEXA) 10 MG tablet, TAKE ONE TABLET BY MOUTH ONCE A DAY, Disp: 30 tablet, Rfl: 0   ibuprofen (ADVIL) 200 MG tablet, Take 200-400 mg by mouth every 6 (six) hours as needed for mild pain (or headaches)., Disp: , Rfl:    Multiple Vitamin (MULTIVITAMIN) tablet, Take 1 tablet by mouth daily., Disp: , Rfl:    Omega-3 1000 MG CAPS, Take 1,000 mg by mouth daily., Disp: , Rfl:    tadalafil (CIALIS) 5 MG tablet, take 1 tablet by mouth once a day for bph, Disp: 100 tablet, Rfl: 0   tamsulosin (FLOMAX) 0.4 MG CAPS capsule, Take 0.4 mg by mouth daily., Disp: , Rfl:   Observations/Objective: Patient is well-developed, well-nourished in no acute distress.  Resting comfortably at home.  Head is normocephalic, atraumatic.  No labored breathing.  Speech is clear and coherent with logical content.  Patient is alert and oriented at baseline.    Assessment and Plan: 1. Bacterial conjunctivitis of left eye (Primary) - ofloxacin (OCUFLOX) 0.3 % ophthalmic solution; Place 1 drop into the left eye 4 (four) times daily for 5 days.  Dispense: 5 mL; Refill: 0  - Suspect bacterial conjunctivitis - Ofloxacin prescribed - Warm compresses - Good hand hygiene - Seek in person evaluation if symptoms worsen or fail to improve   Follow Up Instructions: I discussed the assessment and treatment plan with the patient. The patient was provided an opportunity to ask questions and all were answered. The patient agreed with the plan and demonstrated an understanding of the instructions.  A copy of instructions were sent to the patient via MyChart unless otherwise noted below.    The patient was advised to call back or seek an in-person evaluation if the symptoms worsen or if the condition fails to improve as anticipated.    Margaretann Loveless,  PA-C

## 2024-02-22 NOTE — Patient Instructions (Signed)
 Christopher Leonard, thank you for joining Margaretann Loveless, PA-C for today's virtual visit.  While this provider is not your primary care provider (PCP), if your PCP is located in our provider database this encounter information will be shared with them immediately following your visit.   A [facility] MyChart account gives you access to today's visit and all your visits, tests, and labs performed at [facility] " click here if you don't have a [facility] MyChart account or go to [email or web address]  Consent: (Patient) Christopher Leonard provided verbal consent for this virtual visit at the beginning of the encounter.  Current Medications:  Current Outpatient Medications:    ofloxacin (OCUFLOX) 0.3 % ophthalmic solution, Place 1 drop into the left eye 4 (four) times daily for 5 days., Disp: 5 mL, Rfl: 0   acetaminophen (TYLENOL) 325 MG tablet, Take 325-650 mg by mouth every 6 (six) hours as needed for mild pain (or headaches)., Disp: , Rfl:    citalopram (CELEXA) 10 MG tablet, TAKE ONE TABLET BY MOUTH ONCE A DAY, Disp: 30 tablet, Rfl: 0   ibuprofen (ADVIL) 200 MG tablet, Take 200-400 mg by mouth every 6 (six) hours as needed for mild pain (or headaches)., Disp: , Rfl:    Multiple Vitamin (MULTIVITAMIN) tablet, Take 1 tablet by mouth daily., Disp: , Rfl:    Omega-3 1000 MG CAPS, Take 1,000 mg by mouth daily., Disp: , Rfl:    tadalafil (CIALIS) 5 MG tablet, take 1 tablet by mouth once a day for bph, Disp: 100 tablet, Rfl: 0   tamsulosin (FLOMAX) 0.4 MG CAPS capsule, Take 0.4 mg by mouth daily., Disp: , Rfl:    Medications ordered in this encounter:  Meds ordered this encounter  Medications   ofloxacin (OCUFLOX) 0.3 % ophthalmic solution    Sig: Place 1 drop into the left eye 4 (four) times daily for 5 days.    Dispense:  5 mL    Refill:  0    Supervising Provider:   Merrilee Jansky [[id number]]     *If you need refills on other medications prior to your next appointment, please  contact your pharmacy*  Follow-Up: Call back or seek an in-person evaluation if the symptoms worsen or if the condition fails to improve as anticipated.  [facility] [phone]  Other Instructions Bacterial Conjunctivitis, Adult Bacterial conjunctivitis is an infection of the clear membrane that covers the white part of the eye and the inner surface of the eyelid (conjunctiva). When the blood vessels in the conjunctiva become inflamed, the eye becomes red or pink. The eye often feels irritated or itchy. Bacterial conjunctivitis spreads easily from person to person (is contagious). It also spreads easily from one eye to the other eye. What are the causes? This condition is caused by bacteria. You may get the infection if you come into close contact with: A person who is infected with the bacteria. Items that are contaminated with the bacteria, such as a face towel, contact lens solution, or eye makeup. What increases the risk? You are more likely to develop this condition if: You are exposed to other people who have the infection. You wear contact lenses. You have a sinus infection. You have had a recent eye injury or surgery. You have a weak body defense system (immune system). You have a medical condition that causes dry eyes. What are the signs or symptoms? Symptoms of this condition include: Thick, yellowish discharge from the eye. This  may turn into a crust on the eyelid overnight and cause your eyelids to stick together. Tearing or watery eyes. Itchy eyes. Burning feeling in your eyes. Eye redness. Swollen eyelids. Blurred vision. How is this diagnosed? This condition is diagnosed based on your symptoms and medical history. Your health care provider may also take a sample of discharge from your eye to find the cause of your infection. How is this treated? This condition may be treated with: Antibiotic eye drops or ointment to clear the infection more quickly  and prevent the spread of infection to others. Antibiotic medicines taken by mouth (orally) to treat infections that do not respond to drops or ointments or that last longer than 10 days. Cool, wet cloths (cool compresses) placed on the eyes. Artificial tears applied 2-6 times a day. Follow these instructions at home: Medicines Take or apply your antibiotic medicine as told by your health care provider. Do not stop using the antibiotic, even if your condition improves, unless directed by your health care provider. Take or apply over-the-counter and prescription medicines only as told by your health care provider. Be very careful to avoid touching the edge of your eyelid with the eye-drop bottle or the ointment tube when you apply medicines to the affected eye. This will keep you from spreading the infection to your other eye or to other people. Managing discomfort Gently wipe away any drainage from your eye with a warm, wet washcloth or a cotton ball. Apply a clean, cool compress to your eye for 10-20 minutes, 3-4 times a day. General instructions Do not wear contact lenses until the inflammation is gone and your health care provider says it is safe to wear them again. Ask your health care provider how to sterilize or replace your contact lenses before you use them again. Wear glasses until you can resume wearing contact lenses. Avoid wearing eye makeup until the inflammation is gone. Throw away any old eye cosmetics that may be contaminated. Change or wash your pillowcase every day. Do not share towels or washcloths. This may spread the infection. Wash your hands often with soap and water for at least 20 seconds and especially before touching your face or eyes. Use paper towels to dry your hands. Avoid touching or rubbing your eyes. Do not drive or use heavy machinery if your vision is blurred. Contact a health care provider if: You have a fever. Your symptoms do not get better after 10  days. Get help right away if: You have a fever and your symptoms suddenly get worse. You have severe pain when you move your eye. You have facial pain, redness, or swelling. You have a sudden loss of vision. Summary Bacterial conjunctivitis is an infection of the clear membrane that covers the white part of the eye and the inner surface of the eyelid (conjunctiva). Bacterial conjunctivitis spreads easily from eye to eye and from person to person (is contagious). Wash your hands often with soap and water for at least 20 seconds and especially before touching your face or eyes. Use paper towels to dry your hands. Take or apply your antibiotic medicine as told by your health care provider. Do not stop using the antibiotic even if your condition improves. Contact a health care provider if you have a fever or if your symptoms do not get better after 10 days. Get help right away if you have a sudden loss of vision. This information is not intended to replace advice given to you by your  health care provider. Make sure you discuss any questions you have with your health care provider. Document Revised: 02/11/2021 Document Reviewed: 02/11/2021 Elsevier Patient Education  2024 Elsevier Inc.   If you have been instructed to have an in-person evaluation today at a local Urgent Care facility, please use the link below. It will take you to a list of all of our available [facility], including address, phone number and hours of operation. Please do not delay care.  [facility]  If you or a family member do not have a primary care provider, use the link below to schedule a visit and establish care. When you choose a [facility] primary care physician or advanced practice provider, you gain a long-term partner in health. Find a Primary Care Provider  Learn more about [facility]'s in-office and virtual care options: [facility] - Get Care Now

## 2024-03-19 ENCOUNTER — Other Ambulatory Visit: Payer: Self-pay | Admitting: Family Medicine

## 2024-04-15 ENCOUNTER — Other Ambulatory Visit: Payer: Self-pay | Admitting: Family Medicine

## 2024-04-22 ENCOUNTER — Other Ambulatory Visit: Payer: Self-pay | Admitting: Family Medicine

## 2024-05-14 ENCOUNTER — Other Ambulatory Visit: Payer: Self-pay | Admitting: Family Medicine

## 2024-06-15 ENCOUNTER — Other Ambulatory Visit: Payer: Self-pay | Admitting: Family Medicine

## 2024-06-29 ENCOUNTER — Encounter: Payer: Self-pay | Admitting: Family Medicine

## 2024-06-29 ENCOUNTER — Ambulatory Visit (INDEPENDENT_AMBULATORY_CARE_PROVIDER_SITE_OTHER): Payer: Managed Care, Other (non HMO) | Admitting: Family Medicine

## 2024-06-29 ENCOUNTER — Telehealth: Payer: Self-pay | Admitting: Family Medicine

## 2024-06-29 VITALS — BP 128/78 | HR 75 | Temp 98.2°F | Ht 70.0 in | Wt 166.6 lb

## 2024-06-29 DIAGNOSIS — B182 Chronic viral hepatitis C: Secondary | ICD-10-CM

## 2024-06-29 DIAGNOSIS — E785 Hyperlipidemia, unspecified: Secondary | ICD-10-CM

## 2024-06-29 DIAGNOSIS — Z0000 Encounter for general adult medical examination without abnormal findings: Secondary | ICD-10-CM

## 2024-06-29 DIAGNOSIS — G8929 Other chronic pain: Secondary | ICD-10-CM

## 2024-06-29 DIAGNOSIS — M5442 Lumbago with sciatica, left side: Secondary | ICD-10-CM

## 2024-06-29 DIAGNOSIS — Z125 Encounter for screening for malignant neoplasm of prostate: Secondary | ICD-10-CM

## 2024-06-29 NOTE — Telephone Encounter (Signed)
 Pt forgot to ask his provider to send in 90 day refills please.

## 2024-06-29 NOTE — Patient Instructions (Addendum)
 We have placed a referral for you today to Dr. Claudene sports medicine - please call their # if you do not hear within a week (may be listed below or you may see mychart message within a few days with #).   Please stop by lab before you go If you have mychart- we will send your results within 3 business days of us  receiving them.  If you do not have mychart- we will call you about results within 5 business days of us  receiving them.  *please also note that you will see labs on mychart as soon as they post. I will later go in and write notes on them- will say notes from Dr. Katrinka   Recommended follow up: Return in about 1 year (around 06/29/2025) for physical or sooner if needed.Schedule b4 you leave.

## 2024-06-29 NOTE — Progress Notes (Signed)
 Phone: [phone]   Subjective:  Patient presents today for their annual physical. Chief complaint-noted.   See problem oriented charting- ROS- full  review of systems was completed and negative  Per full ROS sheet completed by patient except for topics noted under acute/chronic concerns  The following were reviewed and entered/updated in epic: Past Medical History:  Diagnosis Date   Anxiety    Cancer (HCC)    basal cell removed from arm    Depression    Hepatitis C    Persistent headaches    Shoulder pain, right    torn rotator cuff- putting off   Substance abuse [facility])    Patient Active Problem List   Diagnosis Date Noted   Alcohol dependence in remission (HCC) 06/25/2020    Priority: High   Chronic hepatitis C without hepatic coma (HCC) 09/06/2007    Priority: High   BPH (benign prostatic hyperplasia) 02/11/2020    Priority: Medium    Hyperlipidemia 06/20/2019    Priority: Medium    Migraine headache without aura 09/10/2013    Priority: Medium    Depression 10/08/2008    Priority: Medium    Lipoma of face 03/15/2019    Priority: Low   Onychomycosis 06/08/2018    Priority: Low   History of adenomatous polyp of colon 12/28/2016    Priority: Low   Erectile dysfunction 12/28/2016    Priority: Low   HPV in male 03/12/2010    Priority: Low   Pain in joint of right shoulder 11/07/2019    Priority: 1.   Past Surgical History:  Procedure Laterality Date   COLONOSCOPY     INGUINAL HERNIA REPAIR     right   INGUINAL HERNIA REPAIR Left 04/14/2023   Procedure: OPEN LEFT INGUINAL HERNIA REPAIR WITH MESH;  Surgeon: Vernetta Berg, MD;  Location: [facility];  Service: General;  Laterality: Left;   INSERTION OF MESH Left 04/14/2023   Procedure: INSERTION OF MESH;  Surgeon: Vernetta Berg, MD;  Location:  SURGERY CENTER;  Service: General;  Laterality: Left;   JOINT REPLACEMENT     POLYPECTOMY     REVERSE TOTAL SHOULDER ARTHROPLASTY Right     sept 2022   STEM cell therapy shoulder     with sedation   TOTAL HIP ARTHROPLASTY Right    11/2021   WISDOM TOOTH EXTRACTION      Family History  Problem Relation Age of Onset   Renal cancer Mother        right nephrectomy. mets to lung- oral meds for that   Non-Hodgkin's lymphoma Mother        chemo   Hypertension Mother    Other Mother        TIA   Parkinson's disease Father        died age 49. complications from parkinsons. 20 years.    Arthritis Sister        hip replacement   Rheumatic fever Brother        cataracts at 51, not sure about heart issues   Colon cancer Maternal Grandfather    Heart attack Maternal Grandfather        age 53 reported 02/11/20 visit   Pancreatic cancer Sister        doing well post surgery   Esophageal cancer Neg Hx    Liver cancer Neg Hx    Rectal cancer Neg Hx    Stomach cancer Neg Hx    Colon polyps Neg Hx  Medications- reviewed and updated Current Outpatient Medications  Medication Sig Dispense Refill   acetaminophen  (TYLENOL ) 325 MG tablet Take 325-650 mg by mouth every 6 (six) hours as needed for mild pain (or headaches).     citalopram  (CELEXA ) 10 MG tablet TAKE ONE TABLET BY MOUTH ONCE A DAY 30 tablet 0   ibuprofen (ADVIL) 200 MG tablet Take 200-400 mg by mouth every 6 (six) hours as needed for mild pain (or headaches).     Multiple Vitamin (MULTIVITAMIN) tablet Take 1 tablet by mouth daily.     Omega-3 1000 MG CAPS Take 1,000 mg by mouth daily.     tadalafil  (CIALIS ) 5 MG tablet take 1 tablet by mouth once a day for bph 100 tablet 0   tamsulosin (FLOMAX) 0.4 MG CAPS capsule Take 0.4 mg by mouth daily.     No current facility-administered medications for this visit.    Allergies-reviewed and updated Allergies  Allergen Reactions   Poison Ivy Extract Itching and Rash    Social History   Social History Narrative   GF. 1 daughter age 65- in town. No grandkids.       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       Hobbies: rest, white water kayaker in past- shoulder prevents   Objective  Objective:  BP 128/78 (BP Location: Left Arm, Patient Position: Sitting, Cuff Size: Normal)   Pulse 75   Temp 98.2 F (36.8 C) (Temporal)   Ht 5' 10 (1.778 m)   Wt 166 lb 9.6 oz (75.6 kg)   SpO2 98%   BMI 23.90 kg/m  Gen: NAD, resting comfortably HEENT: Mucous membranes are moist. Oropharynx normal Neck: no thyromegaly CV: RRR no murmurs rubs or gallops Lungs: CTAB no crackles, wheeze, rhonchi Abdomen: soft/nontender/nondistended/normal bowel sounds. No rebound or guarding.  Ext: no edema Skin: warm, dry Neuro: grossly normal, moves all extremities, PERRLA Declines genitourinary and rectal exam   Assessment and Plan  68 y.o. male presenting for annual physical.  Health Maintenance counseling: 1. Anticipatory guidance: Patient counseled regarding regular dental exams -q6 months, eye exams -yearly,  avoiding smoking and second hand smoke, limiting alcohol to 2 beverages per day-alcohol free over 40 years-congratulated efforts, no illicit drugs .   2. Risk factor reduction:  Advised patient of need for regular exercise and diet rich and fruits and vegetables to reduce risk of heart attack and stroke.  Exercise- COVID a month ago trying to get going again- has kept up walking and plans to restart resistance training.  Diet/weight management-feels could improve diet- has some junk foods that he wants.  Wt Readings from Last 3 Encounters:  06/29/24 166 lb 9.6 oz (75.6 kg)  04/14/23 171 lb 4.8 oz (77.7 kg)  12/23/22 173 lb 6.4 oz (78.7 kg)  3. Immunizations/screenings/ancillary studies-fall flu shot recommended.  Can consider COVID-19 vaccination in the fall-but just had COVID a month ago, Prevnar 20  Immunization History  Administered Date(s) Administered   Hepatitis A, Adult  04/13/2017, 09/26/2017   Hepatitis B, ADULT 04/13/2017, 05/23/2017, 09/26/2017   Influenza Whole 08/15/2009, 08/16/2019   Influenza, High Dose Seasonal PF 08/25/2022   Influenza-Unspecified 08/18/2016, 08/15/2020   Moderna Sars-Covid-2 Vaccination 11/20/2019, 12/17/2019, 11/23/2020   Pneumococcal Polysaccharide-23 03/09/2017   Td 11/15/2001   Td (Adult), 2 Lf Tetanus Toxid, Preservative Free 11/15/2001   Tdap 05/30/2012, 12/23/2022  Zoster Recombinant(Shingrix ) 06/30/2022, 12/23/2022  4. Prostate cancer screening- we referred to [facility] with elevated PSA but actually trended back down last March to 3.57-was maintained on tamsulosin and tadalafil  with Dr. Carolee.  Has had MRI as well as biopsy and thankfully benign.  Has also had microscopic hematuria workup with urology which was reassuring-likely related to BPH Lab Results  Component Value Date   PSA 3.99 12/23/2022   PSA 3.42 06/30/2022   PSA 2.74 06/25/2021   5. Colon cancer screening - 02/09/2021 with 5-year repeat planned with [facility] 6. Skin cancer screening-has been seen in the past- and has follow up scheduled. advised regular sunscreen use. Denies worrisome, changing, or new skin lesions.  7. Smoking associated screening (lung cancer screening, AAA screen 65-75, UA)-former smoker-quit in 1983.  AAA screening May 2018 negative 8. STD screening - dating and not concerned as monogamous- opts out of std screen  Status of chronic or acute concerns   #social update- down to 4 days- off  for 3 day weekend each week!  - was able to move on past long term relationship- was challenging but doing better even though he ended relationship- was not as healthy as he would have liked  # Status post direct left inguinal hernia repair 12/20/2023-patient reports doing well . Open failed and had to repeat.   #dry amd- on areds now  #hyperlipidemia-Coronary artery calcium scoring 0 in 2022  S: Medication: None other than omega-3 Lab Results   Component Value Date   CHOL 180 06/30/2022   HDL 39.70 06/30/2022   LDLCALC 117 (H) 06/30/2022   LDLDIRECT 141.1 10/01/2008   TRIG 118.0 06/30/2022   CHOLHDL 5 06/30/2022   A/P: Lipids only mildly elevated and with CT calcium scoring being reassuring in 2022 likely remain off medicine recheck this in 2027  # Depression S: Medication: Citalopram  10 mg    06/29/2024    2:09 PM 12/30/2022   12:20 PM 12/23/2022    2:58 PM  Depression screen PHQ 2/9  Decreased Interest 0 3 3  Down, Depressed, Hopeless 0 3 3  PHQ - 2 Score 0 6 6  Altered sleeping 0 2 2  Tired, decreased energy 0 2 2  Change in appetite 0 1 1  Feeling bad or failure about yourself  0 2 2  Trouble concentrating 0 0 0  Moving slowly or fidgety/restless 0 0 0  Suicidal thoughts 0 0 0  PHQ-9 Score 0 13 13  Difficult doing work/chores Not difficult at all Somewhat difficult Somewhat difficult  A/P: takes edge/reasonable control- continue current medications    # ED/BPH-tadalafil  5 mg daily helpful-still with some dribbling.  Also on tamsulosin through urology but tolerating- no orthostatic symptoms  -at first had back spasms with high dose   # Alcohol dependence in remission-over 40 years sober!  Congratulated him   # History of treated HCV-update HCV RNA  # [facility] - History of right shoulder replacement with Dr. Melita - History of rotator cuff tear on the left-most recent injection November 2020 for -History of hip replacement January 2024 with Dr. Emmit  -ongoing back pain since starting tamsulosin- does not think related to that- wants to plug in with sports medicine- referral to Dr. Claudene today  Recommended follow up: Return in about 1 year (around 06/29/2025) for physical or sooner if needed.Schedule b4 you leave.  Lab/Order associations:NOT fasting- cereal 10 am   ICD-10-CM   1. Preventative health care  Z00.00  2. Chronic hepatitis C without hepatic coma (HCC)  B18.2 HCV-RNA, Quant Real-Time PCR w/reflex     3. Hyperlipidemia, unspecified hyperlipidemia type  E78.5 CBC w/Diff    Comp Met (CMET)    Lipid panel    4. Screening for prostate cancer  Z12.5 PSA    5. Chronic left-sided low back pain with left-sided sciatica  M54.42 Ambulatory referral to Sports Medicine   G89.29       No orders of the defined types were placed in this encounter.   Return precautions advised.  Garnette Lukes, MD

## 2024-06-29 NOTE — Addendum Note (Signed)
 Addended by: [name] on: 06/29/2024 05:47 PM   Modules accepted: Level of Service

## 2024-07-02 ENCOUNTER — Ambulatory Visit: Payer: Self-pay | Admitting: Family Medicine

## 2024-07-02 LAB — CBC WITH DIFFERENTIAL/PLATELET
Absolute Lymphocytes: 2038 {cells}/uL (ref 850–3900)
Absolute Monocytes: 634 {cells}/uL (ref 200–950)
Basophils Absolute: 29 {cells}/uL (ref 0–200)
Basophils Relative: 0.4 %
Eosinophils Absolute: 108 {cells}/uL (ref 15–500)
Eosinophils Relative: 1.5 %
HCT: 47.9 % (ref 38.5–50.0)
Hemoglobin: 16.3 g/dL (ref 13.2–17.1)
MCH: 32.1 pg (ref 27.0–33.0)
MCHC: 34 g/dL (ref 32.0–36.0)
MCV: 94.3 fL (ref 80.0–100.0)
MPV: 10.2 fL (ref 7.5–12.5)
Monocytes Relative: 8.8 %
Neutro Abs: 4392 {cells}/uL (ref 1500–7800)
Neutrophils Relative %: 61 %
Platelets: 232 Thousand/uL (ref 140–400)
RBC: 5.08 Million/uL (ref 4.20–5.80)
RDW: 12.4 % (ref 11.0–15.0)
Total Lymphocyte: 28.3 %
WBC: 7.2 Thousand/uL (ref 3.8–10.8)

## 2024-07-02 LAB — PSA: PSA: 3.81 ng/mL (ref <=4.00)

## 2024-07-02 LAB — LIPID PANEL
Cholesterol: 206 mg/dL — ABNORMAL HIGH (ref <200)
HDL: 48 mg/dL (ref >=40)
LDL Cholesterol (Calc): 142 mg/dL — ABNORMAL HIGH
Non-HDL Cholesterol (Calc): 158 mg/dL — ABNORMAL HIGH (ref <130)
Total CHOL/HDL Ratio: 4.3 (calc) (ref <5.0)
Triglycerides: 67 mg/dL (ref <150)

## 2024-07-02 LAB — COMPREHENSIVE METABOLIC PANEL WITH GFR
AG Ratio: 1.6 (calc) (ref 1.0–2.5)
ALT: 11 U/L (ref 9–46)
AST: 15 U/L (ref 10–35)
Albumin: 4.4 g/dL (ref 3.6–5.1)
Alkaline phosphatase (APISO): 57 U/L (ref 35–144)
BUN: 19 mg/dL (ref 7–25)
CO2: 28 mmol/L (ref 20–32)
Calcium: 9.5 mg/dL (ref 8.6–10.3)
Chloride: 103 mmol/L (ref 98–110)
Creat: 1.02 mg/dL (ref 0.70–1.35)
Globulin: 2.7 g/dL (ref 1.9–3.7)
Glucose, Bld: 83 mg/dL (ref 65–99)
Potassium: 4.1 mmol/L (ref 3.5–5.3)
Sodium: 139 mmol/L (ref 135–146)
Total Bilirubin: 0.7 mg/dL (ref 0.2–1.2)
Total Protein: 7.1 g/dL (ref 6.1–8.1)
eGFR: 81 mL/min/1.73m2 (ref >=60)

## 2024-07-02 LAB — HCV RNA, QUANT REAL-TIME PCR W/REFLEX
HCV RNA, PCR, QN (Log): <1.18 {Log_IU}/mL
HCV RNA, PCR, QN: <15 [IU]/mL

## 2024-07-19 ENCOUNTER — Other Ambulatory Visit: Payer: Self-pay | Admitting: Family Medicine

## 2024-08-03 ENCOUNTER — Other Ambulatory Visit: Payer: Self-pay | Admitting: Family Medicine

## 2024-08-16 ENCOUNTER — Other Ambulatory Visit: Payer: Self-pay | Admitting: Family Medicine

## 2024-08-16 NOTE — Progress Notes (Unsigned)
 [facility] [address] [place] [address] Phone: [phone] Subjective:   Christopher Leonard, am serving as a scribe for Dr. Arthea Claudene.  I'm seeing this patient by the request  of:  [name], MD  CC: Left sided lower back  YEP:Dlagzrupcz  Christopher Leonard is a 68 y.o. male coming in with complaint of L sided lower back pain with radiating symptoms. Patient states mid to low back pain radiating to L hip sometimes. Can't sleep on side and will cause pain to radiate dow left leg. Thinks L5 disc was laterally displaced. Has been getting muscle spams on R side thoracic.      Past Medical History:  Diagnosis Date   Anxiety    Cancer (HCC)    basal cell removed from arm    Depression    Hepatitis C    Persistent headaches    Shoulder pain, right    torn rotator cuff- putting off   Substance abuse [facility])    Past Surgical History:  Procedure Laterality Date   COLONOSCOPY     INGUINAL HERNIA REPAIR     right   INGUINAL HERNIA REPAIR Left 04/14/2023   Procedure: OPEN LEFT INGUINAL HERNIA REPAIR WITH MESH;  Surgeon: Vernetta Berg, MD;  Location: [facility];  Service: General;  Laterality: Left;   INSERTION OF MESH Left 04/14/2023   Procedure: INSERTION OF MESH;  Surgeon: Vernetta Berg, MD;  Location: [facility];  Service: General;  Laterality: Left;   JOINT REPLACEMENT     POLYPECTOMY     REVERSE TOTAL SHOULDER ARTHROPLASTY Right    sept 2022   STEM cell therapy shoulder     with sedation   TOTAL HIP ARTHROPLASTY Right    11/2021   WISDOM TOOTH EXTRACTION     Social History   Socioeconomic History   Marital status: Single    Spouse name: Not on file   Number of children: Not on file   Years of education: Not on file   Highest education level: Master's degree (e.g., MA, MS, MEng, MEd, MSW, MBA)  Occupational History   Not on file  Tobacco Use   Smoking status: Former    Current packs/day:  0.00    Average packs/day: 1 pack/day for 10.0 years (10.0 ttl pk-yrs)    Types: Cigarettes    Start date: 07/18/1972    Quit date: 07/18/1982    Years since quitting: 42.1   Smokeless tobacco: Former  Substance and Sexual Activity   Alcohol use: No    Comment: last drank alcohol at 68 years of age   Drug use: No   Sexual activity: Not on file  Other Topics Concern   Not on file  Social History Narrative   GF. 1 daughter age 38- in town. No grandkids.       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       Hobbies: rest, white water kayaker in past- shoulder prevents   Social Drivers of Health   Financial Resource Strain: Low Risk  (06/25/2024)   Overall Financial Resource Strain (CARDIA)    Difficulty of Paying Living Expenses: Not hard at all  Food Insecurity: No Food Insecurity (06/25/2024)   Hunger Vital Sign    Worried About Running Out of Food in the Last Year: Never  true    Ran Out of Food in the Last Year: Never true  Transportation Needs: No Transportation Needs (06/25/2024)   PRAPARE - Administrator, Civil Service (Medical): No    Lack of Transportation (Non-Medical): No  Physical Activity: Insufficiently Active (06/25/2024)   Exercise Vital Sign    Days of Exercise per Week: 6 days    Minutes of Exercise per Session: 20 min  Stress: No Stress Concern Present (06/25/2024)   [employer] of Occupational Health - Occupational Stress Questionnaire    Feeling of Stress: Only a little  Social Connections: Moderately Isolated (06/25/2024)   Social Connection and Isolation Panel    Frequency of Communication with Friends and Family: Three times a week    Frequency of Social Gatherings with Friends and Family: More than three times a week    Attends Religious Services: Never    Database administrator or Organizations: Yes    Attends Probation officer: More than 4 times per year    Marital Status: Divorced   Allergies  Allergen Reactions   Poison Ivy Extract Itching and Rash   Family History  Problem Relation Age of Onset   Renal cancer Mother        right nephrectomy. mets to lung- oral meds for that   Non-Hodgkin's lymphoma Mother        chemo   Hypertension Mother    Other Mother        TIA   Parkinson's disease Father        died age 62. complications from parkinsons. 20 years.    Arthritis Sister        hip replacement   Rheumatic fever Brother        cataracts at 47, not sure about heart issues   Colon cancer Maternal Grandfather    Heart attack Maternal Grandfather        age 22 reported 02/11/20 visit   Pancreatic cancer Sister        doing well post surgery   Esophageal cancer Neg Hx    Liver cancer Neg Hx    Rectal cancer Neg Hx    Stomach cancer Neg Hx    Colon polyps Neg Hx      Current Outpatient Medications (Cardiovascular):    tadalafil  (CIALIS ) 5 MG tablet, take 1 tablet by mouth once a day for bph   Current Outpatient Medications (Analgesics):    meloxicam (MOBIC) 15 MG tablet, Take 1 tablet (15 mg total) by mouth daily.   acetaminophen  (TYLENOL ) 325 MG tablet, Take 325-650 mg by mouth every 6 (six) hours as needed for mild pain (or headaches).   ibuprofen (ADVIL) 200 MG tablet, Take 200-400 mg by mouth every 6 (six) hours as needed for mild pain (or headaches).   Current Outpatient Medications (Other):    gabapentin (NEURONTIN) 100 MG capsule, Take 2 capsules (200 mg total) by mouth at bedtime.   citalopram  (CELEXA ) 10 MG tablet, TAKE ONE TABLET BY MOUTH ONCE A DAY   Multiple Vitamin (MULTIVITAMIN) tablet, Take 1 tablet by mouth daily.   Omega-3 1000 MG CAPS, Take 1,000 mg by mouth daily.   tamsulosin (FLOMAX) 0.4 MG CAPS capsule, Take 0.4 mg by mouth daily.   Reviewed prior external information including notes and imaging from  primary care provider As well as notes  that were available from care everywhere and other healthcare systems.  Past medical history, social, surgical and family history all reviewed  in electronic medical record.  No pertanent information unless stated regarding to the chief complaint.   Review of Systems:  No headache, visual changes, nausea, vomiting, diarrhea, constipation, dizziness, abdominal pain, skin rash, fevers, chills, night sweats, weight loss, swollen lymph nodes, body aches, joint swelling, chest pain, shortness of breath, mood changes. POSITIVE muscle aches  Objective  Blood pressure 124/84, pulse 98, height 5' 10 (1.778 m), weight 166 lb (75.3 kg), SpO2 98%.   General: No apparent distress alert and oriented x3 mood and affect normal, dressed appropriately.  HEENT: Pupils equal, extraocular movements intact  Respiratory: Patient's speak in full sentences and does not appear short of breath  Cardiovascular: No lower extremity edema, non tender, no erythema  Low back does have some loss of lordosis noted.  Some tenderness to palpation more in the paraspinal musculature around the L3-L4 and L5-S1 areas on the left side.  Mild tightness with FABER test on the left compared to the right.  Neurovascular intact distally with 5 out of 5 strength noted.  [id number]; 15 additional minutes spent for Therapeutic exercises as stated in above notes.  This included exercises focusing on stretching, strengthening, with significant focus on eccentric aspects.   Long term goals include an improvement in range of motion, strength, endurance as well as avoiding reinjury. Patient's frequency would include in 1-2 times a day, 3-5 times a week for a duration of 6-12 weeks.  Low back exercises that included:  Pelvic tilt/bracing instruction to focus on control of the pelvic girdle and lower abdominal muscles  Glute strengthening exercises, focusing on proper firing of the glutes without engaging the low back muscles Proper stretching techniques for  maximum relief for the hamstrings, hip flexors, low back and some rotation where toleratedProper technique shown and discussed handout in great detail with ATC.  All questions were discussed and answered.       Impression and Recommendations:     The above documentation has been reviewed and is accurate and complete Prim Morace M Allister Lessley, DO

## 2024-08-17 ENCOUNTER — Ambulatory Visit (INDEPENDENT_AMBULATORY_CARE_PROVIDER_SITE_OTHER)

## 2024-08-17 ENCOUNTER — Ambulatory Visit: Admitting: Family Medicine

## 2024-08-17 ENCOUNTER — Encounter: Payer: Self-pay | Admitting: Family Medicine

## 2024-08-17 VITALS — BP 124/84 | HR 98 | Ht 70.0 in | Wt 166.0 lb

## 2024-08-17 DIAGNOSIS — G8929 Other chronic pain: Secondary | ICD-10-CM | POA: Diagnosis not present

## 2024-08-17 DIAGNOSIS — M5442 Lumbago with sciatica, left side: Secondary | ICD-10-CM

## 2024-08-17 DIAGNOSIS — M545 Low back pain, unspecified: Secondary | ICD-10-CM | POA: Insufficient documentation

## 2024-08-17 DIAGNOSIS — M549 Dorsalgia, unspecified: Secondary | ICD-10-CM

## 2024-08-17 MED ORDER — GABAPENTIN 100 MG PO CAPS: 200.0000 mg | ORAL_CAPSULE | Freq: Every day | ORAL | 0 refills | Status: AC

## 2024-08-17 MED ORDER — MELOXICAM 15 MG PO TABS: 15.0000 mg | ORAL_TABLET | Freq: Every day | ORAL | 0 refills | Status: AC

## 2024-08-17 NOTE — Assessment & Plan Note (Signed)
 Low back pain noted.  Discussed icing regimen and home exercises, discussed which activities to do and which ones to avoid.  Gabapentin and meloxicam prescribed as in AVS.  Will start with home exercises but formal physical therapy could be necessary.  X-rays pending.  MRI of patient's pelvis when looking at his prostate 1 year ago did show a protruding disc at L5-S1 that is consistent.  We will continue to monitor.  Follow-up again in 6 to 8 weeks otherwise.

## 2024-08-17 NOTE — Patient Instructions (Addendum)
 Meloxicam for 10 days than as needed Gabapentin prescribed Xrays today Tart [place] 3600mg  See you again in 2 months

## 2024-08-23 ENCOUNTER — Ambulatory Visit: Payer: Self-pay | Admitting: Family Medicine

## 2024-09-27 ENCOUNTER — Other Ambulatory Visit: Payer: Self-pay | Admitting: Family Medicine

## 2024-10-24 NOTE — Progress Notes (Unsigned)
 [facility] [address] [place] [address] Phone: [phone] Subjective:   Christopher Leonard, am serving as a scribe for Dr. Arthea Claudene.  I'm seeing this patient by the request  of:  [name], MD  CC: Low back pain  YEP:Dlagzrupcz  08/17/2024 Low back pain noted.  Discussed icing regimen and home exercises, discussed which activities to do and which ones to avoid.  Gabapentin  and meloxicam  prescribed as in AVS.  Will start with home exercises but formal physical therapy could be necessary.  X-rays pending.  MRI of patient's pelvis when looking at his prostate 1 year ago did show a protruding disc at L5-S1 that is consistent.  We will continue to monitor.  Follow-up again in 6 to 8 weeks otherwise.     Updated 10/26/2024 Christopher Leonard is a 68 y.o. male coming in with complaint of LBP.  Was having pain with some radicular symptoms.  Given meloxicam  and gabapentin .  Started with home exercises.  X-rays did show some moderate to severe degenerative disc disease and arthritic changes of the lumbar spine.  These were independently visualized by me today. Doing okay. No new symptoms. In the same spot. Gabapentin  makes him drowsy and hasn't done the exercises.       Past Medical History:  Diagnosis Date   Anxiety    Cancer (HCC)    basal cell removed from arm    Depression    Hepatitis C    Persistent headaches    Shoulder pain, right    torn rotator cuff- putting off   Substance abuse [facility])    Past Surgical History:  Procedure Laterality Date   COLONOSCOPY     INGUINAL HERNIA REPAIR     right   INGUINAL HERNIA REPAIR Left 04/14/2023   Procedure: OPEN LEFT INGUINAL HERNIA REPAIR WITH MESH;  Surgeon: Vernetta Berg, MD;  Location: [facility];  Service: General;  Laterality: Left;   INSERTION OF MESH Left 04/14/2023   Procedure: INSERTION OF MESH;  Surgeon: Vernetta Berg, MD;  Location: [facility];   Service: General;  Laterality: Left;   JOINT REPLACEMENT     POLYPECTOMY     REVERSE TOTAL SHOULDER ARTHROPLASTY Right    sept 2022   STEM cell therapy shoulder     with sedation   TOTAL HIP ARTHROPLASTY Right    11/2021   WISDOM TOOTH EXTRACTION     Social History   Socioeconomic History   Marital status: Single    Spouse name: Not on file   Number of children: Not on file   Years of education: Not on file   Highest education level: Master's degree (e.g., MA, MS, MEng, MEd, MSW, MBA)  Occupational History   Not on file  Tobacco Use   Smoking status: Former    Current packs/day: 0.00    Average packs/day: 1 pack/day for 10.0 years (10.0 ttl pk-yrs)    Types: Cigarettes    Start date: 07/18/1972    Quit date: 07/18/1982    Years since quitting: 42.3   Smokeless tobacco: Former  Substance and Sexual Activity   Alcohol use: No    Comment: last drank alcohol at 68 years of age   Drug use: No   Sexual activity: Not on file  Other Topics Concern   Not on file  Social History Narrative   GF. 1 daughter age 68- in town. No grandkids.  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       Hobbies: rest, white water kayaker in past- shoulder prevents   Social Drivers of Health   Tobacco Use: Medium Risk (08/17/2024)   Patient History    Smoking Tobacco Use: Former    Smokeless Tobacco Use: Former    Passive Exposure: Not on Actuary Strain: Low Risk (06/25/2024)   Overall Financial Resource Strain (CARDIA)    Difficulty of Paying Living Expenses: Not hard at all  Food Insecurity: No Food Insecurity (06/25/2024)   Epic    Worried About Programme Researcher, Broadcasting/film/video in the Last Year: Never true    Ran Out of Food in the Last Year: Never true  Transportation Needs: No Transportation Needs (06/25/2024)   Epic    Lack of Transportation (Medical): No     Lack of Transportation (Non-Medical): No  Physical Activity: Insufficiently Active (06/25/2024)   Exercise Vital Sign    Days of Exercise per Week: 6 days    Minutes of Exercise per Session: 20 min  Stress: No Stress Concern Present (06/25/2024)   [employer] of Occupational Health - Occupational Stress Questionnaire    Feeling of Stress: Only a little  Social Connections: Moderately Isolated (06/25/2024)   Social Connection and Isolation Panel    Frequency of Communication with Friends and Family: Three times a week    Frequency of Social Gatherings with Friends and Family: More than three times a week    Attends Religious Services: Never    Database Administrator or Organizations: Yes    Attends Engineer, Structural: More than 4 times per year    Marital Status: Divorced  Depression (PHQ2-9): Low Risk (06/29/2024)   Depression (PHQ2-9)    PHQ-2 Score: 0  Alcohol Screen: Low Risk (06/25/2024)   Alcohol Screen    Last Alcohol Screening Score (AUDIT): 4  Housing: Low Risk (06/25/2024)   Epic    Unable to Pay for Housing in the Last Year: No    Number of Times Moved in the Last Year: 0    Homeless in the Last Year: No  Utilities: Not on file  Health Literacy: Not on file   Allergies  Allergen Reactions   Poison Ivy Extract Itching and Rash   Family History  Problem Relation Age of Onset   Renal cancer Mother        right nephrectomy. mets to lung- oral meds for that   Non-Hodgkin's lymphoma Mother        chemo   Hypertension Mother    Other Mother        TIA   Parkinson's disease Father        died age 32. complications from parkinsons. 20 years.    Arthritis Sister        hip replacement   Rheumatic fever Brother        cataracts at 85, not sure about heart issues   Colon cancer Maternal Grandfather    Heart attack Maternal Grandfather        age 39 reported 02/11/20 visit   Pancreatic cancer Sister        doing well post surgery   Esophageal cancer  Neg Hx    Liver cancer Neg Hx    Rectal cancer Neg Hx    Stomach cancer Neg Hx  Colon polyps Neg Hx     Current Outpatient Medications (Cardiovascular):    tadalafil  (CIALIS ) 5 MG tablet, take 1 tablet by mouth once a day for bph  Current Outpatient Medications (Analgesics):    acetaminophen  (TYLENOL ) 325 MG tablet, Take 325-650 mg by mouth every 6 (six) hours as needed for mild pain (or headaches).   ibuprofen (ADVIL) 200 MG tablet, Take 200-400 mg by mouth every 6 (six) hours as needed for mild pain (or headaches).   meloxicam  (MOBIC ) 15 MG tablet, Take 1 tablet (15 mg total) by mouth daily.  Current Outpatient Medications (Other):    citalopram  (CELEXA ) 10 MG tablet, TAKE ONE TABLET BY MOUTH ONCE A DAY   gabapentin  (NEURONTIN ) 100 MG capsule, Take 2 capsules (200 mg total) by mouth at bedtime.   Multiple Vitamin (MULTIVITAMIN) tablet, Take 1 tablet by mouth daily.   Omega-3 1000 MG CAPS, Take 1,000 mg by mouth daily.   tamsulosin (FLOMAX) 0.4 MG CAPS capsule, Take 0.4 mg by mouth daily.   Reviewed prior external information including notes and imaging from  primary care provider As well as notes that were available from care everywhere and other healthcare systems.  Past medical history, social, surgical and family history all reviewed in electronic medical record.  No pertanent information unless stated regarding to the chief complaint.   Review of Systems:  No headache, visual changes, nausea, vomiting, diarrhea, constipation, dizziness, abdominal pain, skin rash, fevers, chills, night sweats, weight loss, swollen lymph nodes, body aches, joint swelling, chest pain, shortness of breath, mood changes. POSITIVE muscle aches  Objective  Blood pressure 116/82, pulse 64, height 5' 10 (1.778 m), weight 168 lb (76.2 kg), SpO2 97%.   General: No apparent distress alert and oriented x3 mood and affect normal, dressed appropriately.  HEENT: Pupils equal, extraocular movements intact   Respiratory: Patient's speak in full sentences and does not appear short of breath  Low back still has some degenerative scoliosis noted.  Does have some limited range of motion noted.  Patient does have some difficulty with flexion extension but no worsening of the pain.  Able to get up from a seated position.    Impression and Recommendations:    The above documentation has been reviewed and is accurate and complete Alick Lecomte M Faisal Stradling, DO

## 2024-10-26 ENCOUNTER — Ambulatory Visit: Admitting: Family Medicine

## 2024-10-26 ENCOUNTER — Other Ambulatory Visit: Payer: Self-pay | Admitting: Family Medicine

## 2024-10-26 VITALS — BP 116/82 | HR 64 | Ht 70.0 in | Wt 168.0 lb

## 2024-10-26 DIAGNOSIS — M5442 Lumbago with sciatica, left side: Secondary | ICD-10-CM | POA: Diagnosis not present

## 2024-10-26 DIAGNOSIS — G8929 Other chronic pain: Secondary | ICD-10-CM

## 2024-10-26 NOTE — Assessment & Plan Note (Signed)
 Pain fairly significant still at this time.  Known degenerative disc disease.  Has been treated for his prostatitis as well.  We discussed with patient about the possibility of repeat imaging and doing more of an MRI of the lumbar spine.  Patient wants to do physical therapy first.  Will start that.  Will see how patient responds.  Follow-up again in 2 months and reevaluate.

## 2024-10-26 NOTE — Patient Instructions (Signed)
 Good to see you! See you again in 2 months

## 2024-10-31 NOTE — Therapy (Signed)
 OUTPATIENT PHYSICAL THERAPY EVALUATION   Patient Name: Christopher Leonard MRN: [id number] DOB:1956-10-23, 68 y.o., male Today's Date: 11/01/2024   END OF SESSION:  PT End of Session - 11/01/24 1034     Visit Number 1    Number of Visits 9    Date for Recertification  12/27/24    Authorization Type [employer]    PT Start Time 1017    PT Stop Time 1100    PT Time Calculation (min) 43 min    Activity Tolerance Patient tolerated treatment well    Behavior During Therapy WFL for tasks assessed/performed          Past Medical History:  Diagnosis Date   Anxiety    Cancer (HCC)    basal cell removed from arm    Depression    Hepatitis C    Persistent headaches    Shoulder pain, right    torn rotator cuff- putting off   Substance abuse (HCC)    Past Surgical History:  Procedure Laterality Date   COLONOSCOPY     INGUINAL HERNIA REPAIR     right   INGUINAL HERNIA REPAIR Left 04/14/2023   Procedure: OPEN LEFT INGUINAL HERNIA REPAIR WITH MESH;  Surgeon: Vernetta Berg, MD;  Location: [facility];  Service: General;  Laterality: Left;   INSERTION OF MESH Left 04/14/2023   Procedure: INSERTION OF MESH;  Surgeon: Vernetta Berg, MD;  Location: [facility];  Service: General;  Laterality: Left;   JOINT REPLACEMENT     POLYPECTOMY     REVERSE TOTAL SHOULDER ARTHROPLASTY Right    sept 2022   STEM cell therapy shoulder     with sedation   TOTAL HIP ARTHROPLASTY Right    11/2021   WISDOM TOOTH EXTRACTION     Patient Active Problem List   Diagnosis Date Noted   Low back pain 08/17/2024   Alcohol dependence in remission (HCC) 06/25/2020   BPH (benign prostatic hyperplasia) 02/11/2020   Pain in joint of right shoulder 11/07/2019   Hyperlipidemia 06/20/2019   Lipoma of face 03/15/2019   Onychomycosis 06/08/2018   History of adenomatous polyp of colon 12/28/2016   Erectile dysfunction 12/28/2016   Migraine headache without aura 09/10/2013   HPV in male  03/12/2010   Depression 10/08/2008   Chronic hepatitis C without hepatic coma (HCC) 09/06/2007    PCP: [name], MD   REFERRING PROVIDER: Claudene Arthea HERO, DO  REFERRING DIAG: Chronic left-sided low back pain with left-sided sciatica  Rationale for Evaluation and Treatment: Rehabilitation  THERAPY DIAG:  Other low back pain  Pain in left hip  Muscle weakness (generalized)  ONSET DATE: Chronic   SUBJECTIVE:      SUBJECTIVE STATEMENT: Patient reports low back pain that currently ok. About a year ago he was having some severe right sided thoracic back spasms, and there was one time he felt an electric shock around T12 region to the right side. States that lately it hasn't bothered much but he can sleep because he will get pain on the outside left hip and down the left thigh. Sometimes with sitting when he crosses his left his left hip will ache a little bit. He does report a history of lower back pain. He used to be a runner but states that the impact bothered his knees too much. He states that his pain is not limiting his activity level but it is more of a fear of injuring himself. He does plan  to retire in about 6 months and begin traveling and hiking more.   PERTINENT HISTORY:  See PMH above Right rTSA, right THA  PAIN:  Are you having pain? Yes:  NPRS scale: 0/10 Pain location: Mid-lower back Pain description: Spasm Aggravating factors: Getting up out of chair Relieving factors: Rest  NPRS scale: 1/10 Pain location: Left hip Pain description: Ache Aggravating factors: Lying on his side, sitting Relieving factors: Changing positions  PRECAUTIONS: None  RED FLAGS: None   WEIGHT BEARING RESTRICTIONS: No  FALLS:  Has patient fallen in last 6 months? No  OCCUPATION: PA - psychiatry so mostly sitting  PLOF: Independent  PATIENT GOALS: Pain relief and find exercises that help   OBJECTIVE:  Note: Objective measures were completed at Evaluation unless  otherwise noted. PATIENT SURVEYS:  Not formally assessed at eval  COGNITION: Overall cognitive status: Within functional limits for tasks assessed     SENSATION: WFL  MUSCLE LENGTH: Slight hamstring and quad limitations, he does report slight lumbar discomfort with prone quad stretch  POSTURE:   Rounded shoulder posture  PALPATION: Non-tender to palpation  He does exhibit spinal hypomobility with lumbar and thoracic CPAs  LUMBAR ROM:   AROM eval  Flexion WFL  Extension 75%  Right lateral flexion WFL  Left lateral flexion WFL  Right rotation 75%  Left rotation 75%   (Blank rows = not tested)  LOWER EXTREMITY ROM:    Hip PROM grossly WFL but he does report lumbar discomfort with hip extension  LOWER EXTREMITY MMT:    MMT Right eval Left eval  Hip flexion 4 4  Hip extension 4- 4-  Hip abduction 4- 4-  Hip adduction    Hip internal rotation    Hip external rotation    Knee flexion 5 5  Knee extension 5 5  Ankle dorsiflexion    Ankle plantarflexion    Ankle inversion    Ankle eversion     (Blank rows = not tested)  FUNCTIONAL TESTS:  Squat: good form and depth, loss of lumbar control at greater depth resulting in lumbar flexion 90-90 hold: able to hold 10 sec without increased pain Bridge with leg extension: demonstrates hip drop bilaterally  GAIT: Assistive device utilized: None Level of assistance: Complete Independence Comments: grossly WFL   TREATMENT  [facility] Adult PT Treatment:                                                DATE: 11/01/2024 90-90 alternating foot tap x 5 each Bridge with alternating knee extension 5 x 5 sec each Side clamshell with green x 8 Sidelying thoracic rotation  Discussed possible etiology of symptoms, goal of therapy primarily to be improving core control and hip strengthening, gradual progression of activity and exercise  PATIENT EDUCATION:  Education details: Exam findings, POC, HEP Person educated:  Patient Education method: Explanation, Demonstration, Tactile cues, Verbal cues, and Handouts Education comprehension: verbalized understanding, returned demonstration, verbal cues required, tactile cues required, and needs further education  HOME EXERCISE PROGRAM: Access Code: [email or web address]    ASSESSMENT: CLINICAL IMPRESSION: Patient is a 68 y.o. male who was seen today for physical therapy evaluation and treatment for chronic right sided mid-lower back pain and left hip and thigh pain. It's unclear if left hip is directly related to back pain, it seems to be a separate issue that  is more consistent with greater trochanteric pain rather than lumbar referral. His reports back pain more right sided lower thoracic region. He does exhibit slight limitations in spinal mobility, but his primary impairment seems to be gross limitation of core stability and hip strength that seems to be contributing to persistent back and hip pain and impacting his functional ability.    OBJECTIVE IMPAIRMENTS: decreased activity tolerance, decreased ROM, decreased strength, impaired flexibility, and pain.   ACTIVITY LIMITATIONS: lifting, sitting, squatting, sleeping, and locomotion level  PARTICIPATION LIMITATIONS: community activity  PERSONAL FACTORS: Fitness, Past/current experiences, and Time since onset of injury/illness/exacerbation are also affecting patient's functional outcome.   REHAB POTENTIAL: Good  CLINICAL DECISION MAKING: Stable/uncomplicated  EVALUATION COMPLEXITY: Low   GOALS: Goals reviewed with patient? Yes  SHORT TERM GOALS: Target date: 11/29/2024  Patient will be I with initial HEP in order to progress with therapy. Baseline: HEP provided at eval Goal status: INITIAL  2.  Patient will report no increase in back or hip pain with current activity level in order to reduce functional limitations Baseline: patient reports left hip pain Goal status: INITIAL  LONG TERM GOALS: Target date:  12/27/2024  Patient will be I with final HEP to maintain progress from PT. Baseline: HEP provided at eval Goal status: INITIAL  2.  Patient will demonstrate glute strength >/= 4/5 MMT in order to improve his activity tolerance Baseline: see limitations above Goal status: INITIAL  3.  Patient will perform DLLT </= 30 deg in order to indicate improved core control and reduce occurrence of back spasms Baseline: not formally assessed Goal status: INITIAL  4.  Patient will demonstrate lumbar AROM grossly WFL and without increased pain in order to improve his mobility Baseline: see limitations above Goal status: INITIAL   PLAN: PT FREQUENCY: 1x/week  PT DURATION: 8 weeks  PLANNED INTERVENTIONS: 97164- PT Re-evaluation, 97750- Physical Performance Testing, 97110-Therapeutic exercises, 97530- Therapeutic activity, 97112- Neuromuscular re-education, 97535- Self Care, 02859- Manual therapy, 20560 (1-2 muscles), 20561 (3+ muscles)- Dry Needling, Patient/Family education, Joint mobilization, Joint manipulation, Spinal manipulation, Spinal mobilization, Cryotherapy, and Moist heat.  PLAN FOR NEXT SESSION: Review HEP and progress PRN, continue core stabilization and hip strengthening, lumbar/thoracic mobility and stretching, postural control, lifting mechanics and progression   Elaine Daring, PT, DPT, LAT, ATC 11/01/2024  2:05 PM Phone: [phone] Fax: [phone]

## 2024-11-01 ENCOUNTER — Encounter: Payer: Self-pay | Admitting: Physical Therapy

## 2024-11-01 ENCOUNTER — Ambulatory Visit: Admitting: Physical Therapy

## 2024-11-01 ENCOUNTER — Other Ambulatory Visit: Payer: Self-pay

## 2024-11-01 DIAGNOSIS — M25552 Pain in left hip: Secondary | ICD-10-CM | POA: Diagnosis not present

## 2024-11-01 DIAGNOSIS — M5459 Other low back pain: Secondary | ICD-10-CM

## 2024-11-01 DIAGNOSIS — M6281 Muscle weakness (generalized): Secondary | ICD-10-CM

## 2024-11-01 NOTE — Patient Instructions (Signed)
 Access Code: [email or web address] URL: [email or web address] Date: 11/01/2024 Prepared by: Elaine Daring  Exercises - Supine 90/90 Alternating Heel Touches with Posterior Pelvic Tilt  - 3-4 x weekly - 3 sets - 5 reps - Alternating Single Leg Bridge  - 3-5 x weekly - 3 sets - 5 reps - 5 seconds each leg hold - Clam with Resistance  - 1 x daily - 3 sets - 8 reps - Sidelying Thoracic Lumbar Rotation  - 1 x daily - 5 reps - 3 seconds hold

## 2024-11-16 ENCOUNTER — Other Ambulatory Visit: Payer: Self-pay

## 2024-11-16 ENCOUNTER — Encounter: Payer: Self-pay | Admitting: Physical Therapy

## 2024-11-16 ENCOUNTER — Ambulatory Visit: Admitting: Physical Therapy

## 2024-11-16 DIAGNOSIS — M6281 Muscle weakness (generalized): Secondary | ICD-10-CM

## 2024-11-16 DIAGNOSIS — M25552 Pain in left hip: Secondary | ICD-10-CM | POA: Diagnosis not present

## 2024-11-16 DIAGNOSIS — M5459 Other low back pain: Secondary | ICD-10-CM | POA: Diagnosis not present

## 2024-11-16 NOTE — Therapy (Signed)
 " OUTPATIENT PHYSICAL THERAPY TREATMENT   Patient Name: Christopher Leonard MRN: [id number] DOB:[birth year], 69 y.o., male Today's Date: 11/16/2024   END OF SESSION:  PT End of Session - 11/16/24 1022     Visit Number 2    Number of Visits 9    Date for Recertification  12/27/24    Authorization Type [employer]    PT Start Time 1019    PT Stop Time 1100    PT Time Calculation (min) 41 min    Activity Tolerance Patient tolerated treatment well    Behavior During Therapy WFL for tasks assessed/performed           Past Medical History:  Diagnosis Date   Anxiety    Cancer (HCC)    basal cell removed from arm    Depression    Hepatitis C    Persistent headaches    Shoulder pain, right    torn rotator cuff- putting off   Substance abuse (HCC)    Past Surgical History:  Procedure Laterality Date   COLONOSCOPY     INGUINAL HERNIA REPAIR     right   INGUINAL HERNIA REPAIR Left 04/14/2023   Procedure: OPEN LEFT INGUINAL HERNIA REPAIR WITH MESH;  Surgeon: Vernetta Berg, MD;  Location: [facility];  Service: General;  Laterality: Left;   INSERTION OF MESH Left 04/14/2023   Procedure: INSERTION OF MESH;  Surgeon: Vernetta Berg, MD;  Location: [facility];  Service: General;  Laterality: Left;   JOINT REPLACEMENT     POLYPECTOMY     REVERSE TOTAL SHOULDER ARTHROPLASTY Right    sept 2022   STEM cell therapy shoulder     with sedation   TOTAL HIP ARTHROPLASTY Right    11/2021   WISDOM TOOTH EXTRACTION     Patient Active Problem List   Diagnosis Date Noted   Low back pain 08/17/2024   Alcohol dependence in remission (HCC) 06/25/2020   BPH (benign prostatic hyperplasia) 02/11/2020   Pain in joint of right shoulder 11/07/2019   Hyperlipidemia 06/20/2019   Lipoma of face 03/15/2019   Onychomycosis 06/08/2018   History of adenomatous polyp of colon 12/28/2016   Erectile dysfunction 12/28/2016   Migraine headache without aura 09/10/2013   HPV in male  03/12/2010   Depression 10/08/2008   Chronic hepatitis C without hepatic coma (HCC) 09/06/2007    PCP: [name], MD   REFERRING PROVIDER: Claudene Arthea HERO, DO  REFERRING DIAG: Chronic left-sided low back pain with left-sided sciatica  Rationale for Evaluation and Treatment: Rehabilitation  THERAPY DIAG:  Other low back pain  Pain in left hip  Muscle weakness (generalized)  ONSET DATE: Chronic   SUBJECTIVE:      SUBJECTIVE STATEMENT: Patient reports he has not done any of his exercises. He does report left shoulder pain worsening and scheduled an appointment with ortho surgeon.  Eval: Patient reports low back pain that currently ok. About a year ago he was having some severe right sided thoracic back spasms, and there was one time he felt an electric shock around T12 region to the right side. States that lately it hasn't bothered much but he can sleep because he will get pain on the outside left hip and down the left thigh. Sometimes with sitting when he crosses his left his left hip will ache a little bit. He does report a history of lower back pain. He used to be a runner but states that the impact bothered  his knees too much. He states that his pain is not limiting his activity level but it is more of a fear of injuring himself. He does plan to retire in about 6 months and begin traveling and hiking more.   PERTINENT HISTORY:  See PMH above Right rTSA, right THA  PAIN:  Are you having pain? Yes:  NPRS scale: 0/10 Pain location: Mid-lower back Pain description: Spasm Aggravating factors: Getting up out of chair Relieving factors: Rest  NPRS scale: 1/10 Pain location: Left hip Pain description: Ache Aggravating factors: Lying on his side, sitting Relieving factors: Changing positions  PRECAUTIONS: None  PATIENT GOALS: Pain relief and find exercises that help   OBJECTIVE:  Note: Objective measures were completed at Evaluation unless otherwise  noted. PATIENT SURVEYS:  Not formally assessed at eval  MUSCLE LENGTH: Slight hamstring and quad limitations, he does report slight lumbar discomfort with prone quad stretch  POSTURE:   Rounded shoulder posture  PALPATION: Non-tender to palpation  He does exhibit spinal hypomobility with lumbar and thoracic CPAs  LUMBAR ROM:   AROM eval  Flexion WFL  Extension 75%  Right lateral flexion WFL  Left lateral flexion WFL  Right rotation 75%  Left rotation 75%   (Blank rows = not tested)  LOWER EXTREMITY ROM:    Hip PROM grossly WFL but he does report lumbar discomfort with hip extension  LOWER EXTREMITY MMT:    MMT Right eval Left eval  Hip flexion 4 4  Hip extension 4- 4-  Hip abduction 4- 4-  Hip adduction    Hip internal rotation    Hip external rotation    Knee flexion 5 5  Knee extension 5 5  Ankle dorsiflexion    Ankle plantarflexion    Ankle inversion    Ankle eversion     (Blank rows = not tested)  FUNCTIONAL TESTS:  Squat: good form and depth, loss of lumbar control at greater depth resulting in lumbar flexion 90-90 hold: able to hold 10 sec without increased pain Bridge with leg extension: demonstrates hip drop bilaterally  GAIT: Assistive device utilized: None Level of assistance: Complete Independence Comments: grossly WFL   TREATMENT  [facility] Adult PT Treatment:                                                DATE: 11/16/2024 Bridge x 10 90-90 alternating foot tap x 7 each, x 9 each SL bridge 2 x 10 each Side clamshell with green 3 x 8 each Pallof press with L1 powerband 3 x 10 each  PATIENT EDUCATION:  Education details: HEP Person educated: Patient Education method: Explanation, Demonstration, Tactile cues, Verbal cues Education comprehension: verbalized understanding, returned demonstration, verbal cues required, tactile cues required, and needs further education  HOME EXERCISE PROGRAM: Access Code: [email or web address]    ASSESSMENT: CLINICAL  IMPRESSION: Patient tolerated therapy well with no adverse effects. Therapy focused on progressing core stabilization and hip strengthening with good tolerance. He was able to progress with his mat based core and hip exercises and incorporated pallof press but patient did report some mild left shoulder discomfort. No changes made to his HEP this visit. Patient would benefit from continued skilled PT to progress mobility and strength in order to reduce pain and maximize functional ability.   Eval: Patient is a 69 y.o. male who was seen today  for physical therapy evaluation and treatment for chronic right sided mid-lower back pain and left hip and thigh pain. It's unclear if left hip is directly related to back pain, it seems to be a separate issue that is more consistent with greater trochanteric pain rather than lumbar referral. His reports back pain more right sided lower thoracic region. He does exhibit slight limitations in spinal mobility, but his primary impairment seems to be gross limitation of core stability and hip strength that seems to be contributing to persistent back and hip pain and impacting his functional ability.    OBJECTIVE IMPAIRMENTS: decreased activity tolerance, decreased ROM, decreased strength, impaired flexibility, and pain.   ACTIVITY LIMITATIONS: lifting, sitting, squatting, sleeping, and locomotion level  PARTICIPATION LIMITATIONS: community activity  PERSONAL FACTORS: Fitness, Past/current experiences, and Time since onset of injury/illness/exacerbation are also affecting patient's functional outcome.    GOALS: Goals reviewed with patient? Yes  SHORT TERM GOALS: Target date: 11/29/2024  Patient will be I with initial HEP in order to progress with therapy. Baseline: HEP provided at eval Goal status: INITIAL  2.  Patient will report no increase in back or hip pain with current activity level in order to reduce functional limitations Baseline: patient reports left  hip pain Goal status: INITIAL  LONG TERM GOALS: Target date: 12/27/2024  Patient will be I with final HEP to maintain progress from PT. Baseline: HEP provided at eval Goal status: INITIAL  2.  Patient will demonstrate glute strength >/= 4/5 MMT in order to improve his activity tolerance Baseline: see limitations above Goal status: INITIAL  3.  Patient will perform DLLT </= 30 deg in order to indicate improved core control and reduce occurrence of back spasms Baseline: not formally assessed Goal status: INITIAL  4.  Patient will demonstrate lumbar AROM grossly WFL and without increased pain in order to improve his mobility Baseline: see limitations above Goal status: INITIAL   PLAN: PT FREQUENCY: 1x/week  PT DURATION: 8 weeks  PLANNED INTERVENTIONS: 97164- PT Re-evaluation, 97750- Physical Performance Testing, 97110-Therapeutic exercises, 97530- Therapeutic activity, 97112- Neuromuscular re-education, 97535- Self Care, 02859- Manual therapy, 20560 (1-2 muscles), 20561 (3+ muscles)- Dry Needling, Patient/Family education, Joint mobilization, Joint manipulation, Spinal manipulation, Spinal mobilization, Cryotherapy, and Moist heat.  PLAN FOR NEXT SESSION: Review HEP and progress PRN, continue core stabilization and hip strengthening, lumbar/thoracic mobility and stretching, postural control, lifting mechanics and progression   Elaine Daring, PT, DPT, LAT, ATC 11/16/2024  11:02 AM Phone: [phone] Fax: [phone]  "

## 2024-11-19 ENCOUNTER — Other Ambulatory Visit: Payer: Self-pay

## 2024-11-19 MED ORDER — TADALAFIL 5 MG PO TABS: 5.0000 mg | ORAL_TABLET | Freq: Every day | ORAL | 0 refills | Status: AC | PRN

## 2024-11-21 ENCOUNTER — Telehealth: Payer: Self-pay | Admitting: Family Medicine

## 2024-11-21 NOTE — Telephone Encounter (Signed)
 [facility] faxed Surgical Clearance, to be filled out by provider. [facility] requested to send it back via Fax within ASAP. Document is located in providers tray at front office.Please advise at [phone].

## 2024-11-23 ENCOUNTER — Encounter: Payer: Self-pay | Admitting: Physical Therapy

## 2024-11-23 ENCOUNTER — Other Ambulatory Visit: Payer: Self-pay

## 2024-11-23 ENCOUNTER — Ambulatory Visit (INDEPENDENT_AMBULATORY_CARE_PROVIDER_SITE_OTHER): Admitting: Physical Therapy

## 2024-11-23 DIAGNOSIS — M6281 Muscle weakness (generalized): Secondary | ICD-10-CM

## 2024-11-23 DIAGNOSIS — M25552 Pain in left hip: Secondary | ICD-10-CM

## 2024-11-23 DIAGNOSIS — M5459 Other low back pain: Secondary | ICD-10-CM | POA: Diagnosis not present

## 2024-11-23 NOTE — Patient Instructions (Signed)
 Access Code: [email or web address] URL: [email or web address] Date: 11/23/2024 Prepared by: Elaine Daring  Exercises - Supine 90/90 Alternating Heel Touches with Posterior Pelvic Tilt  - 3-4 x weekly - 3 sets - 5 reps - Alternating Single Leg Bridge  - 3-5 x weekly - 3 sets - 5 reps - 5 seconds each leg hold - Clam with Resistance  - 3-4 x weekly - 3 sets - 8 reps - Sidelying Thoracic Lumbar Rotation  - 3-4 x weekly - 5 reps - 3 seconds hold - Forward Step Down  - 3-4 x weekly - 3 sets - 5 reps

## 2024-11-23 NOTE — Therapy (Signed)
 " OUTPATIENT PHYSICAL THERAPY TREATMENT   Patient Name: Christopher Leonard MRN: [id number] DOB:[birth year], 69 y.o., male Today's Date: 11/23/2024   END OF SESSION:  PT End of Session - 11/23/24 1033     Visit Number 3    Number of Visits 9    Date for Recertification  12/27/24    Authorization Type [employer]    PT Start Time 9066    PT Stop Time 1015    PT Time Calculation (min) 42 min    Activity Tolerance Patient tolerated treatment well    Behavior During Therapy WFL for tasks assessed/performed            Past Medical History:  Diagnosis Date   Anxiety    Cancer (HCC)    basal cell removed from arm    Depression    Hepatitis C    Persistent headaches    Shoulder pain, right    torn rotator cuff- putting off   Substance abuse (HCC)    Past Surgical History:  Procedure Laterality Date   COLONOSCOPY     INGUINAL HERNIA REPAIR     right   INGUINAL HERNIA REPAIR Left 04/14/2023   Procedure: OPEN LEFT INGUINAL HERNIA REPAIR WITH MESH;  Surgeon: Vernetta Berg, MD;  Location: [facility];  Service: General;  Laterality: Left;   INSERTION OF MESH Left 04/14/2023   Procedure: INSERTION OF MESH;  Surgeon: Vernetta Berg, MD;  Location: [facility];  Service: General;  Laterality: Left;   JOINT REPLACEMENT     POLYPECTOMY     REVERSE TOTAL SHOULDER ARTHROPLASTY Right    sept 2022   STEM cell therapy shoulder     with sedation   TOTAL HIP ARTHROPLASTY Right    11/2021   WISDOM TOOTH EXTRACTION     Patient Active Problem List   Diagnosis Date Noted   Low back pain 08/17/2024   Alcohol dependence in remission (HCC) 06/25/2020   BPH (benign prostatic hyperplasia) 02/11/2020   Pain in joint of right shoulder 11/07/2019   Hyperlipidemia 06/20/2019   Lipoma of face 03/15/2019   Onychomycosis 06/08/2018   History of adenomatous polyp of colon 12/28/2016   Erectile dysfunction 12/28/2016   Migraine headache without aura 09/10/2013   HPV in  male 03/12/2010   Depression 10/08/2008   Chronic hepatitis C without hepatic coma (HCC) 09/06/2007    PCP: [name], MD   REFERRING PROVIDER: Claudene Arthea HERO, DO  REFERRING DIAG: Chronic left-sided low back pain with left-sided sciatica  Rationale for Evaluation and Treatment: Rehabilitation  THERAPY DIAG:  Other low back pain  Pain in left hip  Muscle weakness (generalized)  ONSET DATE: Chronic   SUBJECTIVE:      SUBJECTIVE STATEMENT: Patient reports he saw the doctor and is planning to schedule a rTSA for his left shoulder. He states the back and hip are feeling ok today, he has still not done his exercises.  Eval: Patient reports low back pain that currently ok. About a year ago he was having some severe right sided thoracic back spasms, and there was one time he felt an electric shock around T12 region to the right side. States that lately it hasn't bothered much but he can sleep because he will get pain on the outside left hip and down the left thigh. Sometimes with sitting when he crosses his left his left hip will ache a little bit. He does report a history of lower back pain. He  used to be a runner but states that the impact bothered his knees too much. He states that his pain is not limiting his activity level but it is more of a fear of injuring himself. He does plan to retire in about 6 months and begin traveling and hiking more.   PERTINENT HISTORY:  See PMH above Right rTSA, right THA  PAIN:  Are you having pain? Yes:  NPRS scale: 0/10 Pain location: Mid-lower back Pain description: Spasm Aggravating factors: Getting up out of chair Relieving factors: Rest  NPRS scale: 1/10 Pain location: Left hip Pain description: Ache Aggravating factors: Lying on his side, sitting Relieving factors: Changing positions  PRECAUTIONS: None  PATIENT GOALS: Pain relief and find exercises that help   OBJECTIVE:  Note: Objective measures were completed at  Evaluation unless otherwise noted. PATIENT SURVEYS:  Not formally assessed at eval  MUSCLE LENGTH: Slight hamstring and quad limitations, he does report slight lumbar discomfort with prone quad stretch  POSTURE:   Rounded shoulder posture  PALPATION: Non-tender to palpation  He does exhibit spinal hypomobility with lumbar and thoracic CPAs  LUMBAR ROM:   AROM eval  Flexion WFL  Extension 75%  Right lateral flexion WFL  Left lateral flexion WFL  Right rotation 75%  Left rotation 75%   (Blank rows = not tested)  LOWER EXTREMITY ROM:    Hip PROM grossly WFL but he does report lumbar discomfort with hip extension  LOWER EXTREMITY MMT:    MMT Right eval Left eval  Hip flexion 4 4  Hip extension 4- 4-  Hip abduction 4- 4-  Hip adduction    Hip internal rotation    Hip external rotation    Knee flexion 5 5  Knee extension 5 5  Ankle dorsiflexion    Ankle plantarflexion    Ankle inversion    Ankle eversion     (Blank rows = not tested)  FUNCTIONAL TESTS:  Squat: good form and depth, loss of lumbar control at greater depth resulting in lumbar flexion 90-90 hold: able to hold 10 sec without increased pain Bridge with leg extension: demonstrates hip drop bilaterally  GAIT: Assistive device utilized: None Level of assistance: Complete Independence Comments: grossly WFL   TREATMENT  [facility] Adult PT Treatment:                                                DATE: 11/23/2024 Recumbent bike L4 x 5 min to improve endurance and workload capacity 90-90 alternating foot tap 2 x 10 each SL bridge 2 x 6 x 5 sec each LTR in 90-90 with legs on stability ball x 5 each Side clamshell with green 2 x 10 each Forward heel tap on 4 box 3 x 5 each Lateral band walk with green at knees 2 x 20 down/back  PATIENT EDUCATION:  Education details: HEP update Person educated: Patient Education method: Explanation, Demonstration, Tactile cues, Verbal cues, Handout Education  comprehension: verbalized understanding, returned demonstration, verbal cues required, tactile cues required, and needs further education  HOME EXERCISE PROGRAM: Access Code: [email or web address]    ASSESSMENT: CLINICAL IMPRESSION: Patient tolerated therapy well with no adverse effects. Therapy focused on progressing core stabilization and hip strengthening with good tolerance. He seems to be progressing well but does seem to have difficulty with hip abductor strengthening and exhibits impaired SL control with  step down movement. Updated his HEP to progress his LE strength and control for home. Patient would benefit from continued skilled PT to progress mobility and strength in order to reduce pain and maximize functional ability.   Eval: Patient is a 68 y.o. male who was seen today for physical therapy evaluation and treatment for chronic right sided mid-lower back pain and left hip and thigh pain. It's unclear if left hip is directly related to back pain, it seems to be a separate issue that is more consistent with greater trochanteric pain rather than lumbar referral. His reports back pain more right sided lower thoracic region. He does exhibit slight limitations in spinal mobility, but his primary impairment seems to be gross limitation of core stability and hip strength that seems to be contributing to persistent back and hip pain and impacting his functional ability.    OBJECTIVE IMPAIRMENTS: decreased activity tolerance, decreased ROM, decreased strength, impaired flexibility, and pain.   ACTIVITY LIMITATIONS: lifting, sitting, squatting, sleeping, and locomotion level  PARTICIPATION LIMITATIONS: community activity  PERSONAL FACTORS: Fitness, Past/current experiences, and Time since onset of injury/illness/exacerbation are also affecting patient's functional outcome.    GOALS: Goals reviewed with patient? Yes  SHORT TERM GOALS: Target date: 11/29/2024  Patient will be I with initial HEP in order  to progress with therapy. Baseline: HEP provided at eval Goal status: INITIAL  2.  Patient will report no increase in back or hip pain with current activity level in order to reduce functional limitations Baseline: patient reports left hip pain Goal status: INITIAL  LONG TERM GOALS: Target date: 12/27/2024  Patient will be I with final HEP to maintain progress from PT. Baseline: HEP provided at eval Goal status: INITIAL  2.  Patient will demonstrate glute strength >/= 4/5 MMT in order to improve his activity tolerance Baseline: see limitations above Goal status: INITIAL  3.  Patient will perform DLLT </= 30 deg in order to indicate improved core control and reduce occurrence of back spasms Baseline: not formally assessed Goal status: INITIAL  4.  Patient will demonstrate lumbar AROM grossly WFL and without increased pain in order to improve his mobility Baseline: see limitations above Goal status: INITIAL   PLAN: PT FREQUENCY: 1x/week  PT DURATION: 8 weeks  PLANNED INTERVENTIONS: 97164- PT Re-evaluation, 97750- Physical Performance Testing, 97110-Therapeutic exercises, 97530- Therapeutic activity, 97112- Neuromuscular re-education, 97535- Self Care, 02859- Manual therapy, 20560 (1-2 muscles), 20561 (3+ muscles)- Dry Needling, Patient/Family education, Joint mobilization, Joint manipulation, Spinal manipulation, Spinal mobilization, Cryotherapy, and Moist heat.  PLAN FOR NEXT SESSION: Review HEP and progress PRN, continue core stabilization and hip strengthening, lumbar/thoracic mobility and stretching, postural control, lifting mechanics and progression   Elaine Daring, PT, DPT, LAT, ATC 11/23/2024  10:36 AM Phone: [phone] Fax: [phone]  "

## 2024-11-27 ENCOUNTER — Other Ambulatory Visit: Payer: Self-pay | Admitting: Family Medicine

## 2024-11-30 ENCOUNTER — Encounter: Payer: Self-pay | Admitting: Physical Therapy

## 2024-11-30 ENCOUNTER — Other Ambulatory Visit: Payer: Self-pay

## 2024-11-30 ENCOUNTER — Ambulatory Visit (INDEPENDENT_AMBULATORY_CARE_PROVIDER_SITE_OTHER): Admitting: Physical Therapy

## 2024-11-30 DIAGNOSIS — M5459 Other low back pain: Secondary | ICD-10-CM | POA: Diagnosis not present

## 2024-11-30 DIAGNOSIS — M25552 Pain in left hip: Secondary | ICD-10-CM

## 2024-11-30 DIAGNOSIS — M6281 Muscle weakness (generalized): Secondary | ICD-10-CM

## 2024-11-30 NOTE — Patient Instructions (Signed)
 Access Code: [email or web address] URL: [email or web address] Date: 11/30/2024 Prepared by: Elaine Daring  Exercises - Supine 90/90 Alternating Heel Touches with Posterior Pelvic Tilt  - 3-4 x weekly - 3 sets - 5 reps - Single Leg Bridge  - 3-4 x weekly - 3 sets - 8 reps - Sidelying Thoracic Lumbar Rotation  - 3-4 x weekly - 5 reps - 3 seconds hold - Forward Step Down  - 3-4 x weekly - 3 sets - 5 reps - Side Stepping with Resistance at Thighs  - 3-4 x weekly - 3 sets - 20 reps

## 2024-11-30 NOTE — Therapy (Signed)
 " OUTPATIENT PHYSICAL THERAPY TREATMENT   Patient Name: Christopher Leonard MRN: [id number] DOB:02/11/1956, 69 y.o., male Today's Date: 11/30/2024   END OF SESSION:  PT End of Session - 11/30/24 0933     Visit Number 4    Number of Visits 9    Date for Recertification  12/27/24    Authorization Type [employer]    PT Start Time 0930    PT Stop Time 1010    PT Time Calculation (min) 40 min    Activity Tolerance Patient tolerated treatment well    Behavior During Therapy WFL for tasks assessed/performed             Past Medical History:  Diagnosis Date   Anxiety    Cancer (HCC)    basal cell removed from arm    Depression    Hepatitis C    Persistent headaches    Shoulder pain, right    torn rotator cuff- putting off   Substance abuse (HCC)    Past Surgical History:  Procedure Laterality Date   COLONOSCOPY     INGUINAL HERNIA REPAIR     right   INGUINAL HERNIA REPAIR Left 04/14/2023   Procedure: OPEN LEFT INGUINAL HERNIA REPAIR WITH MESH;  Surgeon: Vernetta Berg, MD;  Location: [facility];  Service: General;  Laterality: Left;   INSERTION OF MESH Left 04/14/2023   Procedure: INSERTION OF MESH;  Surgeon: Vernetta Berg, MD;  Location: [facility];  Service: General;  Laterality: Left;   JOINT REPLACEMENT     POLYPECTOMY     REVERSE TOTAL SHOULDER ARTHROPLASTY Right    sept 2022   STEM cell therapy shoulder     with sedation   TOTAL HIP ARTHROPLASTY Right    11/2021   WISDOM TOOTH EXTRACTION     Patient Active Problem List   Diagnosis Date Noted   Low back pain 08/17/2024   Alcohol dependence in remission (HCC) 06/25/2020   BPH (benign prostatic hyperplasia) 02/11/2020   Pain in joint of right shoulder 11/07/2019   Hyperlipidemia 06/20/2019   Lipoma of face 03/15/2019   Onychomycosis 06/08/2018   History of adenomatous polyp of colon 12/28/2016   Erectile dysfunction 12/28/2016   Migraine headache without aura 09/10/2013   HPV in  male 03/12/2010   Depression 10/08/2008   Chronic hepatitis C without hepatic coma (HCC) 09/06/2007    PCP: [name], MD   REFERRING PROVIDER: Claudene Arthea HERO, DO  REFERRING DIAG: Chronic left-sided low back pain with left-sided sciatica  Rationale for Evaluation and Treatment: Rehabilitation  THERAPY DIAG:  Other low back pain  Pain in left hip  Muscle weakness (generalized)  ONSET DATE: Chronic   SUBJECTIVE:      SUBJECTIVE STATEMENT: Patient reports he did his exercises once since last visit.   Eval: Patient reports low back pain that currently ok. About a year ago he was having some severe right sided thoracic back spasms, and there was one time he felt an electric shock around T12 region to the right side. States that lately it hasn't bothered much but he can sleep because he will get pain on the outside left hip and down the left thigh. Sometimes with sitting when he crosses his left his left hip will ache a little bit. He does report a history of lower back pain. He used to be a runner but states that the impact bothered his knees too much. He states that his pain is not  limiting his activity level but it is more of a fear of injuring himself. He does plan to retire in about 6 months and begin traveling and hiking more.   PERTINENT HISTORY:  See PMH above Right rTSA, right THA  PAIN:  Are you having pain? Yes:  NPRS scale: 0/10 Pain location: Mid-lower back Pain description: Spasm Aggravating factors: Getting up out of chair Relieving factors: Rest  NPRS scale: 1/10 Pain location: Left hip Pain description: Ache Aggravating factors: Lying on his side, sitting Relieving factors: Changing positions  PRECAUTIONS: None  PATIENT GOALS: Pain relief and find exercises that help   OBJECTIVE:  Note: Objective measures were completed at Evaluation unless otherwise noted. PATIENT SURVEYS:  Not formally assessed at eval  MUSCLE LENGTH: Slight hamstring  and quad limitations, he does report slight lumbar discomfort with prone quad stretch  POSTURE:   Rounded shoulder posture  PALPATION: Non-tender to palpation  He does exhibit spinal hypomobility with lumbar and thoracic CPAs  LUMBAR ROM:   AROM eval  Flexion WFL  Extension 75%  Right lateral flexion WFL  Left lateral flexion WFL  Right rotation 75%  Left rotation 75%   (Blank rows = not tested)  LOWER EXTREMITY ROM:    Hip PROM grossly WFL but he does report lumbar discomfort with hip extension  LOWER EXTREMITY MMT:    MMT Right eval Left eval  Hip flexion 4 4  Hip extension 4- 4-  Hip abduction 4- 4-  Hip adduction    Hip internal rotation    Hip external rotation    Knee flexion 5 5  Knee extension 5 5  Ankle dorsiflexion    Ankle plantarflexion    Ankle inversion    Ankle eversion     (Blank rows = not tested)  FUNCTIONAL TESTS:  Squat: good form and depth, loss of lumbar control at greater depth resulting in lumbar flexion 90-90 hold: able to hold 10 sec without increased pain Bridge with leg extension: demonstrates hip drop bilaterally  GAIT: Assistive device utilized: None Level of assistance: Complete Independence Comments: grossly WFL   TREATMENT  [facility] Adult PT Treatment:                                                DATE: 11/30/2024 Recumbent bike L4 x 5 min to improve endurance and workload capacity Forward heel tap on 4 box 2 x 10 each Goblet squat with 15# to table tap 3 x 8 90-90 alternating foot tap 2 x 10 each SL bridge 2 x 8 x 5 sec each Lateral band walk with green at knees 2 x 20 down/back  PATIENT EDUCATION:  Education details: HEP update Person educated: Patient Education method: Explanation, Demonstration, Tactile cues, Verbal cues, Handout Education comprehension: verbalized understanding, returned demonstration, verbal cues required, tactile cues required, and needs further education  HOME EXERCISE PROGRAM: Access Code:  [email or web address]    ASSESSMENT: CLINICAL IMPRESSION: Patient tolerated therapy well with no adverse effects. Therapy continues to focus on hip and core strengthening with good tolerance. He did perform forward heel tap better this visit vut still with impaired balance on control bilaterally. Incorporated goblet squat with patient demonstrating good technique and without any increased pain, but he does report difficulty. He did report feeling it in his lower back with single leg bridge exercise. Updated his HEP to  progress hip strengthening for home. Patient would benefit from continued skilled PT to progress mobility and strength in order to reduce pain and maximize functional ability.   Eval: Patient is a 69 y.o. male who was seen today for physical therapy evaluation and treatment for chronic right sided mid-lower back pain and left hip and thigh pain. It's unclear if left hip is directly related to back pain, it seems to be a separate issue that is more consistent with greater trochanteric pain rather than lumbar referral. His reports back pain more right sided lower thoracic region. He does exhibit slight limitations in spinal mobility, but his primary impairment seems to be gross limitation of core stability and hip strength that seems to be contributing to persistent back and hip pain and impacting his functional ability.    OBJECTIVE IMPAIRMENTS: decreased activity tolerance, decreased ROM, decreased strength, impaired flexibility, and pain.   ACTIVITY LIMITATIONS: lifting, sitting, squatting, sleeping, and locomotion level  PARTICIPATION LIMITATIONS: community activity  PERSONAL FACTORS: Fitness, Past/current experiences, and Time since onset of injury/illness/exacerbation are also affecting patient's functional outcome.    GOALS: Goals reviewed with patient? Yes  SHORT TERM GOALS: Target date: 11/29/2024  Patient will be I with initial HEP in order to progress with therapy. Baseline: HEP  provided at eval 12/01/2023: inconsistent with HEP Goal status: HEP  2.  Patient will report no increase in back or hip pain with current activity level in order to reduce functional limitations Baseline: patient reports left hip pain 12/01/2023: patient with occasional pain Goal status: ONGOING  LONG TERM GOALS: Target date: 12/27/2024  Patient will be I with final HEP to maintain progress from PT. Baseline: HEP provided at eval Goal status: INITIAL  2.  Patient will demonstrate glute strength >/= 4/5 MMT in order to improve his activity tolerance Baseline: see limitations above Goal status: INITIAL  3.  Patient will perform DLLT </= 30 deg in order to indicate improved core control and reduce occurrence of back spasms Baseline: not formally assessed Goal status: INITIAL  4.  Patient will demonstrate lumbar AROM grossly WFL and without increased pain in order to improve his mobility Baseline: see limitations above Goal status: INITIAL   PLAN: PT FREQUENCY: 1x/week  PT DURATION: 8 weeks  PLANNED INTERVENTIONS: 97164- PT Re-evaluation, 97750- Physical Performance Testing, 97110-Therapeutic exercises, 97530- Therapeutic activity, 97112- Neuromuscular re-education, 97535- Self Care, 02859- Manual therapy, 20560 (1-2 muscles), 20561 (3+ muscles)- Dry Needling, Patient/Family education, Joint mobilization, Joint manipulation, Spinal manipulation, Spinal mobilization, Cryotherapy, and Moist heat.  PLAN FOR NEXT SESSION: Review HEP and progress PRN, continue core stabilization and hip strengthening, lumbar/thoracic mobility and stretching, postural control, lifting mechanics and progression   Elaine Daring, PT, DPT, LAT, ATC 11/30/24  10:22 AM Phone: [phone] Fax: [phone]  "

## 2024-12-07 ENCOUNTER — Encounter: Admitting: Physical Therapy

## 2024-12-19 NOTE — Progress Notes (Unsigned)
 " [facility] [address] [place] [address] Phone: [phone] Subjective:   Christopher Leonard Christopher Leonard, am serving as a scribe for Dr. Arthea Claudene.  I'm seeing this patient by the request  of:  [name], MD  CC: Left-sided low back pain  YEP:Dlagzrupcz  10/26/2024 Pain fairly significant still at this time.  Known degenerative disc disease.  Has been treated for his prostatitis as well.  We discussed with patient about the possibility of repeat imaging and doing more of an MRI of the lumbar spine.  Patient wants to do physical therapy first.  Will start that.  Will see how patient responds.  Follow-up again in 2 months and reevaluate.     Updated 12/21/2024 Christopher Leonard is a 69 y.o. male coming in with complaint of back pain, left-sided.  Patient was noted to start with conservative therapy including formal physical therapy. Has had 4 total visits.  Patient states that his back pain has slightly improved.       Past Medical History:  Diagnosis Date   Anxiety    Cancer (HCC)    basal cell removed from arm    Depression    Hepatitis C    Persistent headaches    Shoulder pain, right    torn rotator cuff- putting off   Substance abuse [facility])    Past Surgical History:  Procedure Laterality Date   COLONOSCOPY     INGUINAL HERNIA REPAIR     right   INGUINAL HERNIA REPAIR Left 04/14/2023   Procedure: OPEN LEFT INGUINAL HERNIA REPAIR WITH MESH;  Surgeon: Vernetta Berg, MD;  Location: [facility];  Service: General;  Laterality: Left;   INSERTION OF MESH Left 04/14/2023   Procedure: INSERTION OF MESH;  Surgeon: Vernetta Berg, MD;  Location:  SURGERY CENTER;  Service: General;  Laterality: Left;   JOINT REPLACEMENT     POLYPECTOMY     REVERSE TOTAL SHOULDER ARTHROPLASTY Right    sept 2022   STEM cell therapy shoulder     with sedation   TOTAL HIP ARTHROPLASTY Right    11/2021   WISDOM TOOTH EXTRACTION      Social History   Socioeconomic History   Marital status: Single    Spouse name: Not on file   Number of children: Not on file   Years of education: Not on file   Highest education level: Master's degree (e.g., MA, MS, MEng, MEd, MSW, MBA)  Occupational History   Not on file  Tobacco Use   Smoking status: Former    Current packs/day: 0.00    Average packs/day: 1 pack/day for 10.0 years (10.0 ttl pk-yrs)    Types: Cigarettes    Start date: 07/18/1972    Quit date: 07/18/1982    Years since quitting: 42.4   Smokeless tobacco: Former  Substance and Sexual Activity   Alcohol use: No    Comment: last drank alcohol at 69 years of age   Drug use: No   Sexual activity: Not on file  Other Topics Concern   Not on file  Social History Narrative   GF. 1 daughter age 37- in town. No grandkids.       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       Hobbies: rest, white water kayaker in past- shoulder prevents  Social Drivers of Health   Tobacco Use: Medium Risk (11/30/2024)   Patient History    Smoking Tobacco Use: Former    Smokeless Tobacco Use: Former    Passive Exposure: Not on Actuary Strain: Low Risk (06/25/2024)   Overall Financial Resource Strain (CARDIA)    Difficulty of Paying Living Expenses: Not hard at all  Food Insecurity: No Food Insecurity (06/25/2024)   Epic    Worried About Programme Researcher, Broadcasting/film/video in the Last Year: Never true    Ran Out of Food in the Last Year: Never true  Transportation Needs: No Transportation Needs (06/25/2024)   Epic    Lack of Transportation (Medical): No    Lack of Transportation (Non-Medical): No  Physical Activity: Insufficiently Active (06/25/2024)   Exercise Vital Sign    Days of Exercise per Week: 6 days    Minutes of Exercise per Session: 20 min  Stress: No Stress Concern Present (06/25/2024)    [employer] of Occupational Health - Occupational Stress Questionnaire    Feeling of Stress: Only a little  Social Connections: Moderately Isolated (06/25/2024)   Social Connection and Isolation Panel    Frequency of Communication with Friends and Family: Three times a week    Frequency of Social Gatherings with Friends and Family: More than three times a week    Attends Religious Services: Never    Database Administrator or Organizations: Yes    Attends Banker Meetings: More than 4 times per year    Marital Status: Divorced  Depression (PHQ2-9): Low Risk (06/29/2024)   Depression (PHQ2-9)    PHQ-2 Score: 0  Alcohol Screen: Low Risk (06/25/2024)   Alcohol Screen    Last Alcohol Screening Score (AUDIT): 4  Housing: Low Risk (06/25/2024)   Epic    Unable to Pay for Housing in the Last Year: No    Number of Times Moved in the Last Year: 0    Homeless in the Last Year: No  Utilities: Not on file  Health Literacy: Not on file   Allergies[1] Family History  Problem Relation Age of Onset   Renal cancer Mother        right nephrectomy. mets to lung- oral meds for that   Non-Hodgkin's lymphoma Mother        chemo   Hypertension Mother    Other Mother        TIA   Parkinson's disease Father        died age 61. complications from parkinsons. 20 years.    Arthritis Sister        hip replacement   Rheumatic fever Brother        cataracts at 78, not sure about heart issues   Colon cancer Maternal Grandfather    Heart attack Maternal Grandfather        age 91 reported 02/11/20 visit   Pancreatic cancer Sister        doing well post surgery   Esophageal cancer Neg Hx    Liver cancer Neg Hx    Rectal cancer Neg Hx    Stomach cancer Neg Hx    Colon polyps Neg Hx     Current Outpatient Medications (Cardiovascular):    tadalafil  (CIALIS ) 5 MG tablet, Take 1 tablet (5 mg total) by mouth daily as needed for erectile dysfunction.  Current Outpatient Medications  (Analgesics):    acetaminophen  (TYLENOL ) 325 MG tablet, Take 325-650 mg by mouth every 6 (six) hours as  needed for mild pain (or headaches).   ibuprofen (ADVIL) 200 MG tablet, Take 200-400 mg by mouth every 6 (six) hours as needed for mild pain (or headaches).   meloxicam  (MOBIC ) 15 MG tablet, Take 1 tablet (15 mg total) by mouth daily.  Current Outpatient Medications (Other):    citalopram  (CELEXA ) 10 MG tablet, TAKE ONE TABLET BY MOUTH ONCE A DAY.   gabapentin  (NEURONTIN ) 100 MG capsule, Take 2 capsules (200 mg total) by mouth at bedtime.   Multiple Vitamin (MULTIVITAMIN) tablet, Take 1 tablet by mouth daily.   Omega-3 1000 MG CAPS, Take 1,000 mg by mouth daily.   tamsulosin (FLOMAX) 0.4 MG CAPS capsule, Take 0.4 mg by mouth daily.    Objective  There were no vitals taken for this visit.   General: No apparent distress alert and oriented x3 mood and affect normal, dressed appropriately.  HEENT: Pupils equal, extraocular movements intact  Respiratory: Patient's speak in full sentences and does not appear short of breath  Cardiovascular: No lower extremity edema, non tender, no erythema  Low back exam shows patient does have some loss lordosis noted.  Some tenderness to palpation of the paraspinal musculature.  Patient does have some atrophy of the lower extremities but strength seems to be improved from previous physical therapy notes.    Impression and Recommendations:    The above documentation has been reviewed and is accurate and complete Arthea CHRISTELLA Sharps, DO        [1]  Allergies Allergen Reactions   Poison Ivy Extract Itching and Rash   "

## 2024-12-21 ENCOUNTER — Ambulatory Visit: Admitting: Family Medicine

## 2024-12-21 VITALS — BP 118/90 | HR 60 | Ht 70.0 in | Wt 167.0 lb

## 2024-12-21 DIAGNOSIS — G8929 Other chronic pain: Secondary | ICD-10-CM

## 2024-12-21 NOTE — Assessment & Plan Note (Signed)
 Patient is doing relatively well with the formal physical therapy.  We did discuss with patient about different treatment options including advanced imaging.  Patient wants to hold at this point patient is going to be having a shoulder replacement in the near future.  Discussed with patient that we will continue to monitor and would like to see him 2 months after surgery and then we can discuss how to progress him to the gym and more strengthening.

## 2024-12-21 NOTE — Patient Instructions (Signed)
 Good to see you! See you again in 3 months You're going to do great

## 2025-03-14 ENCOUNTER — Ambulatory Visit: Admitting: Family Medicine

## 2025-07-12 ENCOUNTER — Encounter: Admitting: Family Medicine
